# Patient Record
Sex: Female | Born: 1960 | Race: White | Hispanic: No | Smoking: Current every day smoker
Health system: Southern US, Community
[De-identification: ages and names within clinical notes are randomized; demographics above are authoritative.]

## PROBLEM LIST (undated history)

## (undated) DIAGNOSIS — E039 Hypothyroidism, unspecified: Secondary | ICD-10-CM

## (undated) DIAGNOSIS — I739 Peripheral vascular disease, unspecified: Secondary | ICD-10-CM

## (undated) DIAGNOSIS — R51 Headache: Secondary | ICD-10-CM

## (undated) DIAGNOSIS — G4486 Cervicogenic headache: Secondary | ICD-10-CM

## (undated) DIAGNOSIS — E785 Hyperlipidemia, unspecified: Secondary | ICD-10-CM

## (undated) DIAGNOSIS — J302 Other seasonal allergic rhinitis: Secondary | ICD-10-CM

## (undated) DIAGNOSIS — K589 Irritable bowel syndrome without diarrhea: Secondary | ICD-10-CM

## (undated) DIAGNOSIS — K219 Gastro-esophageal reflux disease without esophagitis: Secondary | ICD-10-CM

## (undated) DIAGNOSIS — A6000 Herpesviral infection of urogenital system, unspecified: Secondary | ICD-10-CM

## (undated) DIAGNOSIS — F419 Anxiety disorder, unspecified: Secondary | ICD-10-CM

## (undated) DIAGNOSIS — F32A Depression, unspecified: Secondary | ICD-10-CM

## (undated) DIAGNOSIS — E669 Obesity, unspecified: Secondary | ICD-10-CM

## (undated) DIAGNOSIS — I10 Essential (primary) hypertension: Secondary | ICD-10-CM

## (undated) DIAGNOSIS — I82409 Acute embolism and thrombosis of unspecified deep veins of unspecified lower extremity: Secondary | ICD-10-CM

## (undated) DIAGNOSIS — IMO0002 Reserved for concepts with insufficient information to code with codable children: Secondary | ICD-10-CM

## (undated) DIAGNOSIS — R42 Dizziness and giddiness: Secondary | ICD-10-CM

## (undated) DIAGNOSIS — H8390 Unspecified disease of inner ear, unspecified ear: Secondary | ICD-10-CM

## (undated) DIAGNOSIS — S139XXA Sprain of joints and ligaments of unspecified parts of neck, initial encounter: Secondary | ICD-10-CM

## (undated) DIAGNOSIS — F329 Major depressive disorder, single episode, unspecified: Secondary | ICD-10-CM

## (undated) DIAGNOSIS — K449 Diaphragmatic hernia without obstruction or gangrene: Secondary | ICD-10-CM

## (undated) DIAGNOSIS — L409 Psoriasis, unspecified: Secondary | ICD-10-CM

## (undated) DIAGNOSIS — G43909 Migraine, unspecified, not intractable, without status migrainosus: Secondary | ICD-10-CM

## (undated) HISTORY — DX: Dizziness and giddiness: R42

## (undated) HISTORY — DX: Obesity, unspecified: E66.9

## (undated) HISTORY — DX: Irritable bowel syndrome without diarrhea: K58.9

## (undated) HISTORY — DX: Cervicogenic headache: G44.86

## (undated) HISTORY — PX: CHOLECYSTECTOMY: SHX55

## (undated) HISTORY — DX: Migraine, unspecified, not intractable, without status migrainosus: G43.909

## (undated) HISTORY — DX: Headache: R51

## (undated) HISTORY — DX: Reserved for concepts with insufficient information to code with codable children: IMO0002

## (undated) HISTORY — PX: ABDOMINAL HYSTERECTOMY: SHX81

## (undated) HISTORY — DX: Herpesviral infection of urogenital system, unspecified: A60.00

## (undated) HISTORY — DX: Sprain of joints and ligaments of unspecified parts of neck, initial encounter: S13.9XXA

## (undated) HISTORY — DX: Psoriasis, unspecified: L40.9

## (undated) HISTORY — DX: Hyperlipidemia, unspecified: E78.5

---

## 2000-08-06 ENCOUNTER — Ambulatory Visit (HOSPITAL_BASED_OUTPATIENT_CLINIC_OR_DEPARTMENT_OTHER): Admission: RE | Admit: 2000-08-06 | Discharge: 2000-08-06 | Payer: Self-pay | Admitting: Plastic Surgery

## 2000-08-06 ENCOUNTER — Encounter (INDEPENDENT_AMBULATORY_CARE_PROVIDER_SITE_OTHER): Payer: Self-pay | Admitting: Specialist

## 2001-01-20 ENCOUNTER — Ambulatory Visit (HOSPITAL_COMMUNITY): Admission: RE | Admit: 2001-01-20 | Discharge: 2001-01-20 | Payer: Self-pay | Admitting: Gastroenterology

## 2001-06-15 ENCOUNTER — Other Ambulatory Visit: Admission: RE | Admit: 2001-06-15 | Discharge: 2001-06-15 | Payer: Self-pay | Admitting: Family Medicine

## 2002-01-28 ENCOUNTER — Other Ambulatory Visit: Admission: RE | Admit: 2002-01-28 | Discharge: 2002-01-28 | Payer: Self-pay

## 2002-09-01 ENCOUNTER — Other Ambulatory Visit: Admission: RE | Admit: 2002-09-01 | Discharge: 2002-09-01 | Payer: Self-pay

## 2003-03-13 ENCOUNTER — Other Ambulatory Visit: Admission: RE | Admit: 2003-03-13 | Discharge: 2003-03-13 | Payer: Self-pay

## 2003-07-28 ENCOUNTER — Other Ambulatory Visit: Admission: RE | Admit: 2003-07-28 | Discharge: 2003-07-28 | Payer: Self-pay | Admitting: Obstetrics and Gynecology

## 2003-12-01 ENCOUNTER — Other Ambulatory Visit: Admission: RE | Admit: 2003-12-01 | Discharge: 2003-12-01 | Payer: Self-pay | Admitting: Obstetrics and Gynecology

## 2004-05-31 ENCOUNTER — Other Ambulatory Visit: Admission: RE | Admit: 2004-05-31 | Discharge: 2004-05-31 | Payer: Self-pay | Admitting: Obstetrics and Gynecology

## 2004-06-21 ENCOUNTER — Encounter: Admission: RE | Admit: 2004-06-21 | Discharge: 2004-06-21 | Payer: Self-pay | Admitting: Family Medicine

## 2004-07-05 ENCOUNTER — Encounter: Admission: RE | Admit: 2004-07-05 | Discharge: 2004-07-05 | Payer: Self-pay | Admitting: Family Medicine

## 2005-01-10 ENCOUNTER — Encounter: Admission: RE | Admit: 2005-01-10 | Discharge: 2005-01-10 | Payer: Self-pay | Admitting: Family Medicine

## 2005-02-07 ENCOUNTER — Other Ambulatory Visit: Admission: RE | Admit: 2005-02-07 | Discharge: 2005-02-07 | Payer: Self-pay | Admitting: Obstetrics and Gynecology

## 2005-07-18 ENCOUNTER — Encounter: Admission: RE | Admit: 2005-07-18 | Discharge: 2005-07-18 | Payer: Self-pay | Admitting: Obstetrics and Gynecology

## 2005-08-18 DIAGNOSIS — I739 Peripheral vascular disease, unspecified: Secondary | ICD-10-CM

## 2005-08-18 HISTORY — DX: Peripheral vascular disease, unspecified: I73.9

## 2005-09-04 ENCOUNTER — Other Ambulatory Visit: Admission: RE | Admit: 2005-09-04 | Discharge: 2005-09-04 | Payer: Self-pay | Admitting: Obstetrics and Gynecology

## 2006-06-26 ENCOUNTER — Ambulatory Visit (HOSPITAL_COMMUNITY): Admission: RE | Admit: 2006-06-26 | Discharge: 2006-06-26 | Payer: Self-pay | Admitting: Family Medicine

## 2006-06-26 ENCOUNTER — Encounter: Payer: Self-pay | Admitting: Vascular Surgery

## 2006-07-31 ENCOUNTER — Encounter: Admission: RE | Admit: 2006-07-31 | Discharge: 2006-07-31 | Payer: Self-pay | Admitting: Obstetrics and Gynecology

## 2007-03-18 ENCOUNTER — Ambulatory Visit: Payer: Self-pay | Admitting: Hematology & Oncology

## 2007-04-14 LAB — CBC & DIFF AND RETIC
BASO%: 1.3 % (ref 0.0–2.0)
Basophils Absolute: 0.1 10*3/uL (ref 0.0–0.1)
EOS%: 3.1 % (ref 0.0–7.0)
Eosinophils Absolute: 0.2 10*3/uL (ref 0.0–0.5)
HCT: 24.6 % — ABNORMAL LOW (ref 34.8–46.6)
HGB: 7.5 g/dL — ABNORMAL LOW (ref 11.6–15.9)
IRF: 0.43 — ABNORMAL HIGH (ref 0.130–0.330)
LYMPH%: 22.5 % (ref 14.0–48.0)
MCH: 17.8 pg — ABNORMAL LOW (ref 26.0–34.0)
MCHC: 30.5 g/dL — ABNORMAL LOW (ref 32.0–36.0)
MCV: 58.2 fL — ABNORMAL LOW (ref 81.0–101.0)
MONO#: 0.4 10*3/uL (ref 0.1–0.9)
MONO%: 7.4 % (ref 0.0–13.0)
NEUT#: 3.6 10*3/uL (ref 1.5–6.5)
NEUT%: 65.7 % (ref 39.6–76.8)
Platelets: 209 10*3/uL (ref 145–400)
RBC: 4.23 10*6/uL (ref 3.70–5.32)
RDW: 20.9 % — ABNORMAL HIGH (ref 11.3–14.5)
RETIC #: 112.1 10*3/uL (ref 19.7–115.1)
Retic %: 2.7 % — ABNORMAL HIGH (ref 0.4–2.3)
WBC: 5.5 10*3/uL (ref 3.9–10.0)
lymph#: 1.2 10*3/uL (ref 0.9–3.3)

## 2007-04-16 LAB — TRANSFERRIN RECEPTOR, SOLUABLE: Transferrin Receptor, Soluble: 173.2 nmol/L

## 2007-05-26 ENCOUNTER — Ambulatory Visit: Payer: Self-pay | Admitting: Hematology & Oncology

## 2007-05-28 LAB — CBC & DIFF AND RETIC
BASO%: 0.5 % (ref 0.0–2.0)
EOS%: 4.4 % (ref 0.0–7.0)
HCT: 37.6 % (ref 34.8–46.6)
IRF: 0.34 — ABNORMAL HIGH (ref 0.130–0.330)
MCH: 27.6 pg (ref 26.0–34.0)
MCHC: 34.5 g/dL (ref 32.0–36.0)
MONO#: 0.3 10*3/uL (ref 0.1–0.9)
RBC: 4.7 10*6/uL (ref 3.70–5.32)
RDW: 32.4 % — ABNORMAL HIGH (ref 11.3–14.5)
RETIC #: 110.9 10*3/uL (ref 19.7–115.1)
Retic %: 2.4 % — ABNORMAL HIGH (ref 0.4–2.3)
WBC: 5.7 10*3/uL (ref 3.9–10.0)
lymph#: 1.3 10*3/uL (ref 0.9–3.3)

## 2007-05-28 LAB — CHCC SMEAR

## 2007-05-31 LAB — FERRITIN: Ferritin: 177 ng/mL (ref 10–291)

## 2007-08-25 ENCOUNTER — Ambulatory Visit: Payer: Self-pay | Admitting: Hematology & Oncology

## 2007-11-24 ENCOUNTER — Ambulatory Visit: Payer: Self-pay | Admitting: Hematology & Oncology

## 2008-06-16 ENCOUNTER — Encounter: Admission: RE | Admit: 2008-06-16 | Discharge: 2008-06-16 | Payer: Self-pay | Admitting: Obstetrics and Gynecology

## 2009-06-05 ENCOUNTER — Encounter: Admission: RE | Admit: 2009-06-05 | Discharge: 2009-06-05 | Payer: Self-pay | Admitting: Family Medicine

## 2009-06-09 ENCOUNTER — Encounter: Admission: RE | Admit: 2009-06-09 | Discharge: 2009-06-09 | Payer: Self-pay | Admitting: Family Medicine

## 2009-06-28 ENCOUNTER — Encounter: Admission: RE | Admit: 2009-06-28 | Discharge: 2009-06-28 | Payer: Self-pay | Admitting: Family Medicine

## 2009-10-04 ENCOUNTER — Emergency Department (HOSPITAL_COMMUNITY): Admission: EM | Admit: 2009-10-04 | Discharge: 2009-10-04 | Payer: Self-pay | Admitting: Emergency Medicine

## 2009-10-05 ENCOUNTER — Observation Stay (HOSPITAL_COMMUNITY): Admission: AD | Admit: 2009-10-05 | Discharge: 2009-10-06 | Payer: Self-pay | Admitting: Surgery

## 2009-10-05 ENCOUNTER — Encounter (INDEPENDENT_AMBULATORY_CARE_PROVIDER_SITE_OTHER): Payer: Self-pay | Admitting: Surgery

## 2010-02-13 ENCOUNTER — Encounter (INDEPENDENT_AMBULATORY_CARE_PROVIDER_SITE_OTHER): Payer: Self-pay | Admitting: Obstetrics and Gynecology

## 2010-02-13 ENCOUNTER — Ambulatory Visit (HOSPITAL_COMMUNITY): Admission: RE | Admit: 2010-02-13 | Discharge: 2010-02-14 | Payer: Self-pay | Admitting: Obstetrics and Gynecology

## 2010-08-23 ENCOUNTER — Encounter
Admission: RE | Admit: 2010-08-23 | Discharge: 2010-08-23 | Payer: Self-pay | Source: Home / Self Care | Attending: Obstetrics and Gynecology | Admitting: Obstetrics and Gynecology

## 2010-11-03 LAB — CBC
HCT: 25.5 % — ABNORMAL LOW (ref 36.0–46.0)
HCT: 32.4 % — ABNORMAL LOW (ref 36.0–46.0)
Hemoglobin: 10.3 g/dL — ABNORMAL LOW (ref 12.0–15.0)
Hemoglobin: 8.2 g/dL — ABNORMAL LOW (ref 12.0–15.0)
MCH: 21.8 pg — ABNORMAL LOW (ref 26.0–34.0)
MCH: 21.9 pg — ABNORMAL LOW (ref 26.0–34.0)
MCHC: 31.9 g/dL (ref 30.0–36.0)
MCV: 68.3 fL — ABNORMAL LOW (ref 78.0–100.0)
MCV: 68.4 fL — ABNORMAL LOW (ref 78.0–100.0)
Platelets: 221 10*3/uL (ref 150–400)
Platelets: 247 10*3/uL (ref 150–400)
RBC: 3.73 MIL/uL — ABNORMAL LOW (ref 3.87–5.11)
RBC: 4.75 MIL/uL (ref 3.87–5.11)
RDW: 18.3 % — ABNORMAL HIGH (ref 11.5–15.5)
WBC: 7.2 10*3/uL (ref 4.0–10.5)

## 2010-11-03 LAB — SURGICAL PCR SCREEN
MRSA, PCR: NEGATIVE
Staphylococcus aureus: POSITIVE — AB

## 2010-11-03 LAB — GLUCOSE, CAPILLARY
Glucose-Capillary: 148 mg/dL — ABNORMAL HIGH (ref 70–99)
Glucose-Capillary: 156 mg/dL — ABNORMAL HIGH (ref 70–99)
Glucose-Capillary: 183 mg/dL — ABNORMAL HIGH (ref 70–99)
Glucose-Capillary: 187 mg/dL — ABNORMAL HIGH (ref 70–99)
Glucose-Capillary: 233 mg/dL — ABNORMAL HIGH (ref 70–99)

## 2010-11-03 LAB — PREGNANCY, URINE: Preg Test, Ur: NEGATIVE

## 2010-11-03 LAB — HEMOGLOBIN A1C
Hgb A1c MFr Bld: 6.9 % — ABNORMAL HIGH (ref <5.7)
Mean Plasma Glucose: 151 mg/dL — ABNORMAL HIGH (ref <117)

## 2010-11-03 LAB — COMPREHENSIVE METABOLIC PANEL
Alkaline Phosphatase: 57 U/L (ref 39–117)
BUN: 4 mg/dL — ABNORMAL LOW (ref 6–23)
CO2: 28 mEq/L (ref 19–32)
Chloride: 101 mEq/L (ref 96–112)
Creatinine, Ser: 0.57 mg/dL (ref 0.4–1.2)
GFR calc non Af Amer: >60 mL/min (ref >60)
Glucose, Bld: 147 mg/dL — ABNORMAL HIGH (ref 70–99)
Total Bilirubin: 0.6 mg/dL (ref 0.3–1.2)

## 2010-11-03 LAB — BASIC METABOLIC PANEL
BUN: 8 mg/dL (ref 6–23)
CO2: 24 mEq/L (ref 19–32)
Calcium: 9.5 mg/dL (ref 8.4–10.5)
Chloride: 104 mEq/L (ref 96–112)
Creatinine, Ser: 0.79 mg/dL (ref 0.4–1.2)
GFR calc Af Amer: >60 mL/min (ref >60)
GFR calc non Af Amer: >60 mL/min (ref >60)
Glucose, Bld: 210 mg/dL — ABNORMAL HIGH (ref 70–99)
Potassium: 3.8 mEq/L (ref 3.5–5.1)
Sodium: 135 mEq/L (ref 135–145)

## 2010-11-03 LAB — ABO/RH: ABO/RH(D): AB POS

## 2010-11-03 LAB — TYPE AND SCREEN
ABO/RH(D): AB POS
Antibody Screen: NEGATIVE

## 2010-11-07 LAB — COMPREHENSIVE METABOLIC PANEL
ALT: 13 U/L (ref 0–35)
AST: 26 U/L (ref 0–37)
Albumin: 3.8 g/dL (ref 3.5–5.2)
Alkaline Phosphatase: 78 U/L (ref 39–117)
BUN: 12 mg/dL (ref 6–23)
Calcium: 8.7 mg/dL (ref 8.4–10.5)
Chloride: 107 mEq/L (ref 96–112)
Creatinine, Ser: 0.62 mg/dL (ref 0.4–1.2)
GFR calc non Af Amer: >60 mL/min (ref >60)
GFR calc non Af Amer: >60 mL/min (ref >60)
Glucose, Bld: 142 mg/dL — ABNORMAL HIGH (ref 70–99)
Glucose, Bld: 158 mg/dL — ABNORMAL HIGH (ref 70–99)
Potassium: 3.5 mEq/L (ref 3.5–5.1)
Potassium: 4 mEq/L (ref 3.5–5.1)
Total Bilirubin: 0.3 mg/dL (ref 0.3–1.2)
Total Bilirubin: 0.8 mg/dL (ref 0.3–1.2)

## 2010-11-07 LAB — URINALYSIS, ROUTINE W REFLEX MICROSCOPIC
Glucose, UA: NEGATIVE mg/dL
Leukocytes, UA: NEGATIVE
Specific Gravity, Urine: 1.028 (ref 1.005–1.030)

## 2010-11-07 LAB — URINE CULTURE: Colony Count: NO GROWTH

## 2010-11-07 LAB — DIFFERENTIAL
Basophils Absolute: 0.1 10*3/uL (ref 0.0–0.1)
Eosinophils Absolute: 0.2 10*3/uL (ref 0.0–0.7)
Lymphocytes Relative: 13 % (ref 12–46)
Monocytes Relative: 5 % (ref 3–12)
Neutro Abs: 7.6 10*3/uL (ref 1.7–7.7)
Neutrophils Relative %: 79 % — ABNORMAL HIGH (ref 43–77)

## 2010-11-07 LAB — URINE MICROSCOPIC-ADD ON

## 2010-11-07 LAB — CBC
HCT: 28.6 % — ABNORMAL LOW (ref 36.0–46.0)
HCT: 33.4 % — ABNORMAL LOW (ref 36.0–46.0)
Hemoglobin: 10.6 g/dL — ABNORMAL LOW (ref 12.0–15.0)
Hemoglobin: 9.4 g/dL — ABNORMAL LOW (ref 12.0–15.0)
MCHC: 32.8 g/dL (ref 30.0–36.0)
MCV: 71.9 fL — ABNORMAL LOW (ref 78.0–100.0)
MCV: 72.4 fL — ABNORMAL LOW (ref 78.0–100.0)
Platelets: 269 10*3/uL (ref 150–400)
Platelets: 318 10*3/uL (ref 150–400)
RDW: 28.2 % — ABNORMAL HIGH (ref 11.5–15.5)
WBC: 9.6 10*3/uL (ref 4.0–10.5)

## 2010-11-07 LAB — LIPASE, BLOOD: Lipase: 32 U/L (ref 11–59)

## 2010-11-07 LAB — TROPONIN I: Troponin I: 0.01 ng/mL (ref 0.00–0.06)

## 2010-11-07 LAB — CK TOTAL AND CKMB (NOT AT ARMC): Relative Index: INVALID (ref 0.0–2.5)

## 2011-01-03 NOTE — Procedures (Signed)
[facility]  Patient:    Erin Chandler, Erin Chandler                         MRN: [id number] Proc. Date: 01/20/01 Adm. Date:  [id number] Attending:  Louie Bun CC:         Stacie Acres. Cliffton Asters, M.D.   Procedure Report  PROCEDURE:  Esophagogastroduodenoscopy.  ENDOSCOPIST:  Everardo All. Madilyn Fireman, M.D.  INDICATIONS FOR PROCEDURE:  Severe reflux symptoms with hoarseness not adequately controlled by proton pump inhibitor.  DESCRIPTION OF PROCEDURE:   The patient was placed in the left lateral decubitus position and placed on the pulse monitor with continuous low-flow oxygen delivered by nasal cannula.  She was sedated with 50 mg IV Demerol and 6 mg IV Versed.  The Olympus video endoscope was advanced under direct vision into the oropharynx and esophagus.  The esophagus was straight and of normal caliber with the squamocolumnar line at 36 cm above a 3 cm hiatal hernia.  The GE junction appeared very patulous and was reflux was noted during the procedure.  There were no visible erosions and no ring or stricture appreciated.  The stomach was entered, and a small amount of liquid secretions were suctioned from the fundus.  Retroflexed view of the cardia was somewhat difficult due to inability to hold air, but was otherwise unremarkable.  The body and antrum appeared normal.  The duodenum was entered and both bulb and second portion were well inspected and appeared to be within normal limits. The endoscope was then withdrawn back into the stomach and CLOtest obtained. The scope was then withdrawn, and the patient returned to the recovery room in stable condition.  She tolerated the procedure well, and there were no immediate complications.  IMPRESSION:  A 3 cm hiatal hernia with patulous gastroesophageal junction.  PLAN:  Will continue proton pump inhibitor.  Consider adding a nighttime dose of Reglan, and will evaluate for possibility of antireflux surgery if  medical therapy is not satisfactory. DD:  01/20/01 TD:  01/20/01 Job: [id number]

## 2011-05-07 ENCOUNTER — Other Ambulatory Visit: Payer: Self-pay | Admitting: Dermatology

## 2011-07-04 ENCOUNTER — Other Ambulatory Visit: Payer: Self-pay | Admitting: Orthopedic Surgery

## 2011-07-08 ENCOUNTER — Encounter (HOSPITAL_BASED_OUTPATIENT_CLINIC_OR_DEPARTMENT_OTHER): Payer: Self-pay | Admitting: *Deleted

## 2011-07-14 ENCOUNTER — Encounter (HOSPITAL_BASED_OUTPATIENT_CLINIC_OR_DEPARTMENT_OTHER)
Admission: RE | Admit: 2011-07-14 | Discharge: 2011-07-14 | Disposition: A | Discharge status: Home / Self Care | Payer: Managed Care, Other (non HMO) | Source: Ambulatory Visit | Attending: Orthopedic Surgery | Admitting: Orthopedic Surgery

## 2011-07-14 ENCOUNTER — Other Ambulatory Visit: Payer: Self-pay

## 2011-07-14 LAB — BASIC METABOLIC PANEL
BUN: 13 mg/dL (ref 6–23)
CO2: 24 mEq/L (ref 19–32)
Glucose, Bld: 138 mg/dL — ABNORMAL HIGH (ref 70–99)
Potassium: 3.9 mEq/L (ref 3.5–5.1)
Sodium: 138 mEq/L (ref 135–145)

## 2011-07-16 ENCOUNTER — Encounter (HOSPITAL_BASED_OUTPATIENT_CLINIC_OR_DEPARTMENT_OTHER): Payer: Self-pay | Admitting: Anesthesiology

## 2011-07-16 ENCOUNTER — Encounter (HOSPITAL_BASED_OUTPATIENT_CLINIC_OR_DEPARTMENT_OTHER): Payer: Self-pay | Admitting: Orthopedic Surgery

## 2011-07-16 ENCOUNTER — Encounter (HOSPITAL_BASED_OUTPATIENT_CLINIC_OR_DEPARTMENT_OTHER)
Admission: RE | Disposition: A | Discharge status: Home / Self Care | Payer: Self-pay | Source: Ambulatory Visit | Attending: Orthopedic Surgery

## 2011-07-16 ENCOUNTER — Ambulatory Visit (HOSPITAL_BASED_OUTPATIENT_CLINIC_OR_DEPARTMENT_OTHER): Payer: Managed Care, Other (non HMO) | Admitting: Anesthesiology

## 2011-07-16 ENCOUNTER — Ambulatory Visit (HOSPITAL_BASED_OUTPATIENT_CLINIC_OR_DEPARTMENT_OTHER)
Admission: RE | Admit: 2011-07-16 | Discharge: 2011-07-16 | Disposition: A | Discharge status: Home / Self Care | Payer: Managed Care, Other (non HMO) | Source: Ambulatory Visit | Attending: Orthopedic Surgery | Admitting: Orthopedic Surgery

## 2011-07-16 DIAGNOSIS — E119 Type 2 diabetes mellitus without complications: Secondary | ICD-10-CM | POA: Insufficient documentation

## 2011-07-16 DIAGNOSIS — I10 Essential (primary) hypertension: Secondary | ICD-10-CM | POA: Insufficient documentation

## 2011-07-16 DIAGNOSIS — Z01810 Encounter for preprocedural cardiovascular examination: Secondary | ICD-10-CM | POA: Insufficient documentation

## 2011-07-16 DIAGNOSIS — Z01812 Encounter for preprocedural laboratory examination: Secondary | ICD-10-CM | POA: Insufficient documentation

## 2011-07-16 DIAGNOSIS — G5600 Carpal tunnel syndrome, unspecified upper limb: Secondary | ICD-10-CM | POA: Insufficient documentation

## 2011-07-16 HISTORY — DX: Headache: R51

## 2011-07-16 HISTORY — PX: CARPAL TUNNEL RELEASE: SHX101

## 2011-07-16 HISTORY — DX: Hypothyroidism, unspecified: E03.9

## 2011-07-16 HISTORY — DX: Major depressive disorder, single episode, unspecified: F32.9

## 2011-07-16 HISTORY — DX: Depression, unspecified: F32.A

## 2011-07-16 HISTORY — DX: Gastro-esophageal reflux disease without esophagitis: K21.9

## 2011-07-16 HISTORY — DX: Unspecified disease of inner ear, unspecified ear: H83.90

## 2011-07-16 HISTORY — DX: Other seasonal allergic rhinitis: J30.2

## 2011-07-16 HISTORY — DX: Peripheral vascular disease, unspecified: I73.9

## 2011-07-16 HISTORY — DX: Anxiety disorder, unspecified: F41.9

## 2011-07-16 HISTORY — DX: Essential (primary) hypertension: I10

## 2011-07-16 HISTORY — DX: Diaphragmatic hernia without obstruction or gangrene: K44.9

## 2011-07-16 HISTORY — DX: Acute embolism and thrombosis of unspecified deep veins of unspecified lower extremity: I82.409

## 2011-07-16 LAB — POCT HEMOGLOBIN-HEMACUE: Hemoglobin: 11.9 g/dL — ABNORMAL LOW (ref 12.0–15.0)

## 2011-07-16 SURGERY — CARPAL TUNNEL RELEASE
Anesthesia: Monitor Anesthesia Care | Site: Wrist | Laterality: Right | Wound class: Clean

## 2011-07-16 MED ORDER — MEPERIDINE HCL 25 MG/ML IJ SOLN: 6.2500 mg | INTRAMUSCULAR | Status: DC | PRN

## 2011-07-16 MED ORDER — CEFAZOLIN SODIUM 1-5 GM-% IV SOLN
1.0000 g | INTRAVENOUS | Status: AC
  Administered 2011-07-16: 1 g via INTRAVENOUS

## 2011-07-16 MED ORDER — FENTANYL CITRATE 0.05 MG/ML IJ SOLN: 25.0000 ug | INTRAMUSCULAR | Status: DC | PRN

## 2011-07-16 MED ORDER — PENTAZOCINE-NALOXONE 50-0.5 MG PO TABS: 1.0000 | ORAL_TABLET | ORAL | Status: AC | PRN

## 2011-07-16 MED ORDER — FENTANYL CITRATE 0.05 MG/ML IJ SOLN
INTRAMUSCULAR | Status: DC | PRN
  Administered 2011-07-16: 50 ug via INTRAVENOUS

## 2011-07-16 MED ORDER — ONDANSETRON HCL 4 MG/2ML IJ SOLN
INTRAMUSCULAR | Status: DC | PRN
  Administered 2011-07-16: 4 mg via INTRAVENOUS

## 2011-07-16 MED ORDER — LACTATED RINGERS IV SOLN
INTRAVENOUS | Status: DC
  Administered 2011-07-16 (×2): via INTRAVENOUS

## 2011-07-16 MED ORDER — PROMETHAZINE HCL 25 MG/ML IJ SOLN: 6.2500 mg | INTRAMUSCULAR | Status: DC | PRN

## 2011-07-16 MED ORDER — CHLORHEXIDINE GLUCONATE 4 % EX LIQD: 60.0000 mL | Freq: Once | CUTANEOUS | Status: DC

## 2011-07-16 MED ORDER — DEXAMETHASONE SODIUM PHOSPHATE 4 MG/ML IJ SOLN
INTRAMUSCULAR | Status: DC | PRN
  Administered 2011-07-16: 10 mg via INTRAVENOUS

## 2011-07-16 MED ORDER — BUPIVACAINE HCL (PF) 0.25 % IJ SOLN
INTRAMUSCULAR | Status: DC | PRN
  Administered 2011-07-16: 5 mL

## 2011-07-16 SURGICAL SUPPLY — 38 items
BANDAGE COBAN STERILE 3 (GAUZE/BANDAGES/DRESSINGS) ×1 IMPLANT
BANDAGE GAUZE ELAST BULKY 4 IN (GAUZE/BANDAGES/DRESSINGS) ×2 IMPLANT
BLADE SURG 15 STRL LF DISP TIS (BLADE) ×1 IMPLANT
BLADE SURG 15 STRL SS (BLADE) ×2
BNDG CMPR 9X4 STRL LF SNTH (GAUZE/BANDAGES/DRESSINGS)
BNDG COHESIVE 3X5 TAN STRL LF (GAUZE/BANDAGES/DRESSINGS) ×2 IMPLANT
BNDG ESMARK 4X9 LF (GAUZE/BANDAGES/DRESSINGS) IMPLANT
CHLORAPREP W/TINT 26ML (MISCELLANEOUS) ×2 IMPLANT
CLOTH BEACON ORANGE TIMEOUT ST (SAFETY) ×2 IMPLANT
CORDS BIPOLAR (ELECTRODE) ×2 IMPLANT
COVER MAYO STAND STRL (DRAPES) ×2 IMPLANT
COVER TABLE BACK 60X90 (DRAPES) ×2 IMPLANT
CUFF TOURNIQUET SINGLE 18IN (TOURNIQUET CUFF) ×1 IMPLANT
DRAPE EXTREMITY T 121X128X90 (DRAPE) ×2 IMPLANT
DRAPE SURG 17X23 STRL (DRAPES) ×2 IMPLANT
DRSG KUZMA FLUFF (GAUZE/BANDAGES/DRESSINGS) ×2 IMPLANT
GAUZE KERLIX 2  STERILE LF (GAUZE/BANDAGES/DRESSINGS) ×1 IMPLANT
GAUZE XEROFORM 1X8 LF (GAUZE/BANDAGES/DRESSINGS) ×2 IMPLANT
GLOVE BIO SURGEON STRL SZ 6.5 (GLOVE) ×2 IMPLANT
GLOVE SURG ORTHO 8.0 STRL STRW (GLOVE) ×2 IMPLANT
GOWN BRE IMP PREV XXLGXLNG (GOWN DISPOSABLE) ×2 IMPLANT
GOWN PREVENTION PLUS XLARGE (GOWN DISPOSABLE) ×2 IMPLANT
NEEDLE 27GAX1X1/2 (NEEDLE) IMPLANT
PACK BASIN DAY SURGERY FS (CUSTOM PROCEDURE TRAY) ×2 IMPLANT
PAD CAST 3X4 CTTN HI CHSV (CAST SUPPLIES) ×1 IMPLANT
PADDING CAST ABS 4INX4YD NS (CAST SUPPLIES) ×1
PADDING CAST ABS COTTON 4X4 ST (CAST SUPPLIES) ×1 IMPLANT
PADDING CAST COTTON 3X4 STRL (CAST SUPPLIES) ×2
PADDING WEBRIL 3 STERILE (GAUZE/BANDAGES/DRESSINGS) ×1 IMPLANT
SPONGE GAUZE 4X4 12PLY (GAUZE/BANDAGES/DRESSINGS) ×2 IMPLANT
STOCKINETTE 4X48 STRL (DRAPES) ×2 IMPLANT
SUT VICRYL 4-0 PS2 18IN ABS (SUTURE) IMPLANT
SUT VICRYL RAPIDE 4/0 PS 2 (SUTURE) ×2 IMPLANT
SYR BULB 3OZ (MISCELLANEOUS) ×2 IMPLANT
SYR CONTROL 10ML LL (SYRINGE) IMPLANT
TOWEL OR 17X24 6PK STRL BLUE (TOWEL DISPOSABLE) ×2 IMPLANT
UNDERPAD 30X30 INCONTINENT (UNDERPADS AND DIAPERS) ×2 IMPLANT
WATER STERILE IRR 1000ML POUR (IV SOLUTION) ×2 IMPLANT

## 2011-07-16 NOTE — Brief Op Note (Signed)
07/16/2011  10:04 AM  PATIENT:  Erin Chandler  50 y.o. female  PRE-OPERATIVE DIAGNOSIS:  carpal tunnel syndrome right  POST-OPERATIVE DIAGNOSIS:  same as preop  PROCEDURE:  Procedure(s): CARPAL TUNNEL RELEASE  SURGEON:  Surgeon(s): Nicki Reaper, MD  PHYSICIAN ASSISTANT:   ASSISTANTS: none   ANESTHESIA:   local and regional  EBL:     BLOOD ADMINISTERED:none  DRAINS: none   LOCAL MEDICATIONS USED:  MARCAINE 5CC  SPECIMEN:  No Specimen  DISPOSITION OF SPECIMEN:  N/A  COUNTS:  YES  TOURNIQUET:   Total Tourniquet Time Documented: Forearm (Right) - 17 minutes  DICTATION: .Note written in EPIC  PLAN OF CARE: Discharge to home after PACU  PATIENT DISPOSITION:  PACU - hemodynamically stable.

## 2011-07-16 NOTE — Anesthesia Postprocedure Evaluation (Signed)
  Anesthesia Post-op Note  Patient: Erin Chandler  Procedure(s) Performed:  CARPAL TUNNEL RELEASE  Patient Location: PACU  Anesthesia Type: MAC and Bier block  Level of Consciousness: awake  Airway and Oxygen Therapy: Patient Spontanous Breathing and Patient connected to face mask oxygen  Post-op Pain: none  Post-op Assessment: Post-op Vital signs reviewed, Patient's Cardiovascular Status Stable, Respiratory Function Stable and Patent Airway  Post-op Vital Signs: stable  Complications: No apparent anesthesia complications

## 2011-07-16 NOTE — Transfer of Care (Signed)
Immediate Anesthesia Transfer of Care Note  Patient: Erin Chandler  Procedure(s) Performed:  CARPAL TUNNEL RELEASE  Patient Location: PACU  Anesthesia Type: MAC and Bier block  Level of Consciousness: awake and oriented  Airway & Oxygen Therapy: Patient Spontanous Breathing and Patient connected to face mask oxygen  Post-op Assessment: Report given to PACU RN and Post -op Vital signs reviewed and stable  Post vital signs: Reviewed and stable  Complications: No apparent anesthesia complications

## 2011-07-16 NOTE — Op Note (Signed)
Pre op: CTS Rt Post op: same  Operation: CTR Rt  Surgeon: Daryll Brod PROCEDURE:  The patient was brought to the operating room where a forearm based IV regional anesthetic was carried out without difficulty. The patient was prepped using ChloraPrep, supine position, right arm free.  A 3- minute dry time was allowed.  Time-out taken confirming the patient and procedure.  A longitudinal incision was made in the palm, carried down through subcutaneous tissue.  Bleeders were electrocauterized.  Palmar fascia was split.  Superficial palmar arch identified.  The flexor tendon to the ring and little finger identified.  To the ulnar side of the median nerve, the carpal retinaculum was incised with sharp dissection.  Right angle and Sewell retractor were placed between skin and forearm fascia.  The fascia was released for approximately a cm and half proximal to the wrist crease under direct vision.  The canal was explored.  Area of compression to the nerve was apparent.  No further lesions were identified.  The wound was irrigated.  The skin was then closed with interrupted 5-0 Vicryl Rapide sutures.  A local infiltration with 0.25% Marcaine without epinephrine was then injected, approximately 5 mL was used.  A sterile compressive dressing was applied with the fingers free.  On deflation of the tourniquet, all fingers immediately pinked.  She was taken to the recovery room for observation in satisfactory condition.

## 2011-07-16 NOTE — Anesthesia Preprocedure Evaluation (Signed)
Anesthesia Evaluation  Patient identified by MRN, date of birth, ID band Patient awake    Reviewed: Allergy & Precautions, H&P , NPO status , Patient's Chart, lab work & pertinent test results  Airway Mallampati: III TM Distance: >3 FB Neck ROM: full    Dental No notable dental hx. (+) Teeth Intact   Pulmonary  clear to auscultation  Pulmonary exam normal       Cardiovascular hypertension, On Medications regular Normal    Neuro/Psych Negative Psych ROS   GI/Hepatic Neg liver ROS, Controlled and Medicated,  Endo/Other  Well Controlled, Type 2  Renal/GU negative Renal ROS  Genitourinary negative   Musculoskeletal   Abdominal   Peds  Hematology negative hematology ROS (+)   Anesthesia Other Findings   Reproductive/Obstetrics negative OB ROS                           Anesthesia Physical Anesthesia Plan  ASA: III  Anesthesia Plan: MAC and Bier Block   Post-op Pain Management:    Induction: Intravenous  Airway Management Planned: Mask  Additional Equipment:   Intra-op Plan:   Post-operative Plan:   Informed Consent: I have reviewed the patients History and Physical, chart, labs and discussed the procedure including the risks, benefits and alternatives for the proposed anesthesia with the patient or authorized representative who has indicated his/her understanding and acceptance.     Plan Discussed with: CRNA  Anesthesia Plan Comments:         Anesthesia Quick Evaluation
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
## 2011-07-18 ENCOUNTER — Encounter (HOSPITAL_BASED_OUTPATIENT_CLINIC_OR_DEPARTMENT_OTHER): Payer: Self-pay | Admitting: Orthopedic Surgery

## 2011-08-05 ENCOUNTER — Other Ambulatory Visit: Payer: Self-pay | Admitting: Orthopedic Surgery

## 2011-08-07 ENCOUNTER — Encounter (HOSPITAL_BASED_OUTPATIENT_CLINIC_OR_DEPARTMENT_OTHER): Payer: Self-pay | Admitting: *Deleted

## 2011-08-08 ENCOUNTER — Encounter (HOSPITAL_BASED_OUTPATIENT_CLINIC_OR_DEPARTMENT_OTHER)
Admission: RE | Admit: 2011-08-08 | Discharge: 2011-08-08 | Disposition: A | Discharge status: Home / Self Care | Payer: Managed Care, Other (non HMO) | Source: Ambulatory Visit | Attending: Orthopedic Surgery | Admitting: Orthopedic Surgery

## 2011-08-08 LAB — BASIC METABOLIC PANEL
GFR calc non Af Amer: >90 mL/min (ref >90)
Glucose, Bld: 134 mg/dL — ABNORMAL HIGH (ref 70–99)
Potassium: 4 mEq/L (ref 3.5–5.1)
Sodium: 136 mEq/L (ref 135–145)

## 2011-08-13 ENCOUNTER — Ambulatory Visit (HOSPITAL_BASED_OUTPATIENT_CLINIC_OR_DEPARTMENT_OTHER): Payer: Managed Care, Other (non HMO) | Admitting: Certified Registered Nurse Anesthetist

## 2011-08-13 ENCOUNTER — Encounter (HOSPITAL_BASED_OUTPATIENT_CLINIC_OR_DEPARTMENT_OTHER): Payer: Self-pay | Admitting: Certified Registered Nurse Anesthetist

## 2011-08-13 ENCOUNTER — Encounter (HOSPITAL_BASED_OUTPATIENT_CLINIC_OR_DEPARTMENT_OTHER): Payer: Self-pay

## 2011-08-13 ENCOUNTER — Encounter (HOSPITAL_BASED_OUTPATIENT_CLINIC_OR_DEPARTMENT_OTHER)
Admission: RE | Disposition: A | Discharge status: Home / Self Care | Payer: Self-pay | Source: Ambulatory Visit | Attending: Orthopedic Surgery

## 2011-08-13 ENCOUNTER — Ambulatory Visit (HOSPITAL_BASED_OUTPATIENT_CLINIC_OR_DEPARTMENT_OTHER)
Admission: RE | Admit: 2011-08-13 | Discharge: 2011-08-13 | Disposition: A | Discharge status: Home / Self Care | Payer: Managed Care, Other (non HMO) | Source: Ambulatory Visit | Attending: Orthopedic Surgery | Admitting: Orthopedic Surgery

## 2011-08-13 DIAGNOSIS — K449 Diaphragmatic hernia without obstruction or gangrene: Secondary | ICD-10-CM | POA: Insufficient documentation

## 2011-08-13 DIAGNOSIS — J45909 Unspecified asthma, uncomplicated: Secondary | ICD-10-CM | POA: Insufficient documentation

## 2011-08-13 DIAGNOSIS — Z01812 Encounter for preprocedural laboratory examination: Secondary | ICD-10-CM | POA: Insufficient documentation

## 2011-08-13 DIAGNOSIS — K08409 Partial loss of teeth, unspecified cause, unspecified class: Secondary | ICD-10-CM | POA: Insufficient documentation

## 2011-08-13 DIAGNOSIS — R51 Headache: Secondary | ICD-10-CM | POA: Insufficient documentation

## 2011-08-13 DIAGNOSIS — I10 Essential (primary) hypertension: Secondary | ICD-10-CM | POA: Insufficient documentation

## 2011-08-13 DIAGNOSIS — G5600 Carpal tunnel syndrome, unspecified upper limb: Secondary | ICD-10-CM | POA: Insufficient documentation

## 2011-08-13 DIAGNOSIS — K219 Gastro-esophageal reflux disease without esophagitis: Secondary | ICD-10-CM | POA: Insufficient documentation

## 2011-08-13 HISTORY — PX: CARPAL TUNNEL RELEASE: SHX101

## 2011-08-13 LAB — GLUCOSE, CAPILLARY
Glucose-Capillary: 132 mg/dL — ABNORMAL HIGH (ref 70–99)
Glucose-Capillary: 136 mg/dL — ABNORMAL HIGH (ref 70–99)

## 2011-08-13 SURGERY — CARPAL TUNNEL RELEASE
Anesthesia: Monitor Anesthesia Care | Laterality: Left

## 2011-08-13 MED ORDER — PENTAZOCINE-NALOXONE 50-0.5 MG PO TABS: 1.0000 | ORAL_TABLET | ORAL | Status: AC | PRN

## 2011-08-13 MED ORDER — MEPERIDINE HCL 25 MG/ML IJ SOLN: 6.2500 mg | INTRAMUSCULAR | Status: DC | PRN

## 2011-08-13 MED ORDER — LIDOCAINE HCL (CARDIAC) 20 MG/ML IV SOLN
INTRAVENOUS | Status: DC | PRN
  Administered 2011-08-13: 6 mg via INTRAVENOUS

## 2011-08-13 MED ORDER — LACTATED RINGERS IV SOLN
INTRAVENOUS | Status: DC
  Administered 2011-08-13: 08:00:00 via INTRAVENOUS

## 2011-08-13 MED ORDER — BUPIVACAINE HCL (PF) 0.25 % IJ SOLN
INTRAMUSCULAR | Status: DC | PRN
  Administered 2011-08-13: 6 mL

## 2011-08-13 MED ORDER — PROMETHAZINE HCL 25 MG/ML IJ SOLN
6.2500 mg | INTRAMUSCULAR | Status: DC | PRN
  Administered 2011-08-13: 6.25 mg via INTRAVENOUS

## 2011-08-13 MED ORDER — FENTANYL CITRATE 0.05 MG/ML IJ SOLN
INTRAMUSCULAR | Status: DC | PRN
  Administered 2011-08-13: 50 ug via INTRAVENOUS

## 2011-08-13 MED ORDER — MIDAZOLAM HCL 5 MG/5ML IJ SOLN
INTRAMUSCULAR | Status: DC | PRN
  Administered 2011-08-13: 2 mg via INTRAVENOUS

## 2011-08-13 MED ORDER — FENTANYL CITRATE 0.05 MG/ML IJ SOLN: 25.0000 ug | INTRAMUSCULAR | Status: DC | PRN

## 2011-08-13 MED ORDER — CHLORHEXIDINE GLUCONATE 4 % EX LIQD: 60.0000 mL | Freq: Once | CUTANEOUS | Status: DC

## 2011-08-13 MED ORDER — PENTAZOCINE-NALOXONE HCL 50-0.5 MG PO TABS
1.0000 | ORAL_TABLET | ORAL | Status: DC | PRN
  Administered 2011-08-13: 1 via ORAL

## 2011-08-13 MED ORDER — LIDOCAINE HCL (PF) 0.5 % IJ SOLN
INTRAMUSCULAR | Status: DC | PRN
  Administered 2011-08-13: 30 mL

## 2011-08-13 MED ORDER — PROPOFOL 10 MG/ML IV EMUL
INTRAVENOUS | Status: DC | PRN
  Administered 2011-08-13: 75 ug/kg/min via INTRAVENOUS

## 2011-08-13 MED ORDER — ONDANSETRON HCL 4 MG/2ML IJ SOLN
INTRAMUSCULAR | Status: DC | PRN
  Administered 2011-08-13: 4 mg via INTRAVENOUS

## 2011-08-13 SURGICAL SUPPLY — 36 items
BANDAGE GAUZE ELAST BULKY 4 IN (GAUZE/BANDAGES/DRESSINGS) ×2 IMPLANT
BLADE SURG 15 STRL LF DISP TIS (BLADE) ×1 IMPLANT
BLADE SURG 15 STRL SS (BLADE) ×2
BNDG CMPR 9X4 STRL LF SNTH (GAUZE/BANDAGES/DRESSINGS)
BNDG COHESIVE 3X5 TAN STRL LF (GAUZE/BANDAGES/DRESSINGS) ×2 IMPLANT
BNDG ESMARK 4X9 LF (GAUZE/BANDAGES/DRESSINGS) IMPLANT
CHLORAPREP W/TINT 26ML (MISCELLANEOUS) ×2 IMPLANT
CLOTH BEACON ORANGE TIMEOUT ST (SAFETY) ×2 IMPLANT
CORDS BIPOLAR (ELECTRODE) ×2 IMPLANT
COVER MAYO STAND STRL (DRAPES) ×2 IMPLANT
COVER TABLE BACK 60X90 (DRAPES) ×2 IMPLANT
CUFF TOURNIQUET SINGLE 18IN (TOURNIQUET CUFF) IMPLANT
DRAPE EXTREMITY T 121X128X90 (DRAPE) ×2 IMPLANT
DRAPE SURG 17X23 STRL (DRAPES) ×2 IMPLANT
DRSG KUZMA FLUFF (GAUZE/BANDAGES/DRESSINGS) ×2 IMPLANT
GAUZE XEROFORM 1X8 LF (GAUZE/BANDAGES/DRESSINGS) ×2 IMPLANT
GLOVE BIO SURGEON STRL SZ 6.5 (GLOVE) ×1 IMPLANT
GLOVE SS N UNI LF 8.0 STRL (GLOVE) ×1 IMPLANT
GLOVE SURG ORTHO 8.0 STRL STRW (GLOVE) ×1 IMPLANT
GOWN BRE IMP PREV XXLGXLNG (GOWN DISPOSABLE) ×2 IMPLANT
GOWN PREVENTION PLUS XLARGE (GOWN DISPOSABLE) ×2 IMPLANT
NEEDLE 27GAX1X1/2 (NEEDLE) IMPLANT
PACK BASIN DAY SURGERY FS (CUSTOM PROCEDURE TRAY) ×2 IMPLANT
PAD CAST 3X4 CTTN HI CHSV (CAST SUPPLIES) ×1 IMPLANT
PADDING CAST ABS 4INX4YD NS (CAST SUPPLIES) ×1
PADDING CAST ABS COTTON 4X4 ST (CAST SUPPLIES) ×1 IMPLANT
PADDING CAST COTTON 3X4 STRL (CAST SUPPLIES) ×2
SPONGE GAUZE 4X4 12PLY (GAUZE/BANDAGES/DRESSINGS) ×2 IMPLANT
STOCKINETTE 4X48 STRL (DRAPES) ×2 IMPLANT
SUT VICRYL 4-0 PS2 18IN ABS (SUTURE) IMPLANT
SUT VICRYL RAPIDE 4/0 PS 2 (SUTURE) ×2 IMPLANT
SYR BULB 3OZ (MISCELLANEOUS) ×2 IMPLANT
SYR CONTROL 10ML LL (SYRINGE) IMPLANT
TOWEL OR 17X24 6PK STRL BLUE (TOWEL DISPOSABLE) ×2 IMPLANT
UNDERPAD 30X30 INCONTINENT (UNDERPADS AND DIAPERS) ×2 IMPLANT
WATER STERILE IRR 1000ML POUR (IV SOLUTION) ×1 IMPLANT

## 2011-08-13 NOTE — Op Note (Signed)
NAMEASENATH, Erin Chandler NO.:  [id number]  MEDICAL RECORD NO.:  [id number]  LOCATION:                                 FACILITY:  PHYSICIAN:  Cindee Salt, M.D.            DATE OF BIRTH:  DATE OF PROCEDURE:  08/13/2011 DATE OF DISCHARGE:                              OPERATIVE REPORT   PREOPERATIVE DIAGNOSIS:  Carpal tunnel syndrome, left hand.  POSTOPERATIVE DIAGNOSIS:  Carpal tunnel syndrome, left hand.  OPERATION:  Decompression of left median nerve.  SURGEON:  Cindee Salt, M.D.  ANESTHESIA:  Forearm-based IV regional.  ANESTHESIOLOGIST:  Zenon Mayo, M.D.  HISTORY:  The patient is a 50 year old female with a history of bilateral carpal tunnel syndrome.  EMG nerve conductions are positive. This has not responded to conservative treatment.  She has elected to undergo surgical decompression of the nerve.  She has undergone right release.  She is admitted now for left.  PROCEDURE:  The patient was brought to the operating room where forearm- based IV regional anesthetic was carried out without difficulty.  She was prepped using ChloraPrep, supine position, left arm free.  A 3- minute dry time was allowed.  Time-out taken for confirming the patient and procedure.  A longitudinal incision was made in the palm, carried down through subcutaneously.  Bleeders were electrocauterized with bipolar.  The palmar fascia was split.  Superficial palmar arch identified.  The flexor tendon to the ring and little finger identified to the ulnar side of the median nerve.  Carpal retinaculum was incised with sharp dissection.  Right angle and Sewall retractor were placed between skin and forearm fascia.  The fascia was released for approximately a centimeter and half proximal to the wrist crease under direct vision.  Canal was explored.  Air compression to the nerve was apparent.  No further lesions were identified.  The wound was irrigated. Skin was closed with  interrupted 4-0 Vicryl Rapide sutures.  Local infiltration with 0.25% Marcaine without epinephrine was given, 5 mL was used.  Sterile compressive dressing with fingers-free was applied. Deflation of the tourniquet, all fingers immediately pinked.  She was taken to the recovery room for observation in satisfactory condition. She will be discharged home to return to [facility] [employer] in 1 week, on [place] [facility].         ______________________________ Cindee Salt, M.D.    GK/MEDQ  D:  08/13/2011  T:  08/13/2011  Job:  [id number]

## 2011-08-13 NOTE — Op Note (Signed)
Dictated number: [id number]
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
## 2011-08-13 NOTE — Brief Op Note (Signed)
08/13/2011  9:12 AM  PATIENT:  Huel Cote  50 y.o. female  PRE-OPERATIVE DIAGNOSIS:  left carpal tunnel syndrome  POST-OPERATIVE DIAGNOSIS:  left carpal tunnel syndrome  PROCEDURE:  Procedure(s): CARPAL TUNNEL RELEASE  SURGEON:  Surgeon(s): Nicki Reaper, MD  PHYSICIAN ASSISTANT:   ASSISTANTS: none   ANESTHESIA:   local and regional  EBL:  Total I/O In: 800 [I.V.:800] Out: -   BLOOD ADMINISTERED:none  DRAINS: none   LOCAL MEDICATIONS USED:  MARCAINE 5CC  SPECIMEN:  No Specimen  DISPOSITION OF SPECIMEN:  N/A  COUNTS:  YES  TOURNIQUET:   Total Tourniquet Time Documented: Forearm (Left) - 22 minutes  DICTATION: .Other Dictation: Dictation Number [id number]  PLAN OF CARE: Discharge to home after PACU  PATIENT DISPOSITION:  PACU - hemodynamically stable.   Delay start of Pharmacological VTE agent (>24hrs) due to surgical blood loss or risk of bleeding:  {YES/NO/NOT APPLICABLE:20182

## 2011-08-13 NOTE — Transfer of Care (Signed)
Immediate Anesthesia Transfer of Care Note  Patient: Erin Chandler  Procedure(s) Performed:  CARPAL TUNNEL RELEASE  Patient Location: PACU  Anesthesia Type: MAC and General  Level of Consciousness: awake, alert  and oriented  Airway & Oxygen Therapy: Patient Spontanous Breathing and Patient connected to face mask oxygen  Post-op Assessment: Report given to PACU RN  Post vital signs: Reviewed and stable  Complications: No apparent anesthesia complications

## 2011-08-13 NOTE — Anesthesia Postprocedure Evaluation (Signed)
  Anesthesia Post-op Note  Patient: Erin Chandler  Procedure(s) Performed:  CARPAL TUNNEL RELEASE  Patient Location: PACU  Anesthesia Type: MAC and Bier block  Level of Consciousness: awake  Airway and Oxygen Therapy: Patient Spontanous Breathing and Patient connected to face mask oxygen  Post-op Pain: none  Post-op Assessment: Post-op Vital signs reviewed, Patient's Cardiovascular Status Stable, Respiratory Function Stable and Patent Airway  Post-op Vital Signs: Reviewed and stable  Complications: No apparent anesthesia complications

## 2011-08-13 NOTE — Anesthesia Preprocedure Evaluation (Signed)
Anesthesia Evaluation  Patient identified by MRN, date of birth, ID band Patient awake    Reviewed: Allergy & Precautions, H&P , NPO status , Patient's Chart, lab work & pertinent test results  Airway Mallampati: II TM Distance: >3 FB Neck ROM: Full    Dental No notable dental hx. (+) Partial Upper and Partial Lower   Pulmonary asthma ,  clear to auscultation  Pulmonary exam normal       Cardiovascular hypertension, On Medications Regular Normal    Neuro/Psych  Headaches, PSYCHIATRIC DISORDERS    GI/Hepatic Neg liver ROS, hiatal hernia, GERD-  Medicated and Controlled,  Endo/Other  Well Controlled  Renal/GU negative Renal ROS  Genitourinary negative   Musculoskeletal   Abdominal   Peds  Hematology negative hematology ROS (+)   Anesthesia Other Findings   Reproductive/Obstetrics negative OB ROS                           Anesthesia Physical Anesthesia Plan  ASA: III  Anesthesia Plan: MAC and Bier Block   Post-op Pain Management:    Induction: Intravenous  Airway Management Planned: Simple Face Mask  Additional Equipment:   Intra-op Plan:   Post-operative Plan:   Informed Consent: I have reviewed the patients History and Physical, chart, labs and discussed the procedure including the risks, benefits and alternatives for the proposed anesthesia with the patient or authorized representative who has indicated his/her understanding and acceptance.     Plan Discussed with: CRNA  Anesthesia Plan Comments:         Anesthesia Quick Evaluation

## 2011-08-18 ENCOUNTER — Encounter (HOSPITAL_BASED_OUTPATIENT_CLINIC_OR_DEPARTMENT_OTHER): Payer: Self-pay | Admitting: Orthopedic Surgery

## 2012-04-16 ENCOUNTER — Encounter: Payer: Managed Care, Other (non HMO) | Admitting: Physical Therapy

## 2012-06-01 ENCOUNTER — Emergency Department (HOSPITAL_COMMUNITY)
Admission: EM | Admit: 2012-06-01 | Discharge: 2012-06-02 | Disposition: A | Discharge status: Home / Self Care | Payer: Managed Care, Other (non HMO) | Attending: Emergency Medicine | Admitting: Emergency Medicine

## 2012-06-01 ENCOUNTER — Encounter (HOSPITAL_COMMUNITY): Payer: Self-pay | Admitting: Emergency Medicine

## 2012-06-01 ENCOUNTER — Emergency Department (HOSPITAL_COMMUNITY): Payer: Managed Care, Other (non HMO)

## 2012-06-01 DIAGNOSIS — R109 Unspecified abdominal pain: Secondary | ICD-10-CM

## 2012-06-01 DIAGNOSIS — Z9889 Other specified postprocedural states: Secondary | ICD-10-CM | POA: Insufficient documentation

## 2012-06-01 DIAGNOSIS — K7689 Other specified diseases of liver: Secondary | ICD-10-CM | POA: Insufficient documentation

## 2012-06-01 DIAGNOSIS — I10 Essential (primary) hypertension: Secondary | ICD-10-CM | POA: Insufficient documentation

## 2012-06-01 DIAGNOSIS — R10819 Abdominal tenderness, unspecified site: Secondary | ICD-10-CM | POA: Insufficient documentation

## 2012-06-01 LAB — COMPREHENSIVE METABOLIC PANEL
ALT: 10 U/L (ref 0–35)
AST: 18 U/L (ref 0–37)
Alkaline Phosphatase: 105 U/L (ref 39–117)
CO2: 27 mEq/L (ref 19–32)
Chloride: 97 mEq/L (ref 96–112)
GFR calc Af Amer: >90 mL/min (ref >90)
GFR calc non Af Amer: >90 mL/min (ref >90)
Glucose, Bld: 113 mg/dL — ABNORMAL HIGH (ref 70–99)
Sodium: 137 mEq/L (ref 135–145)
Total Bilirubin: 0.2 mg/dL — ABNORMAL LOW (ref 0.3–1.2)

## 2012-06-01 LAB — URINALYSIS, ROUTINE W REFLEX MICROSCOPIC
Bilirubin Urine: NEGATIVE
Hgb urine dipstick: NEGATIVE
Ketones, ur: NEGATIVE mg/dL
Nitrite: NEGATIVE
Protein, ur: NEGATIVE mg/dL
Urobilinogen, UA: 0.2 mg/dL (ref 0.0–1.0)

## 2012-06-01 LAB — CBC WITH DIFFERENTIAL/PLATELET
Basophils Absolute: 0.1 10*3/uL (ref 0.0–0.1)
Eosinophils Relative: 4 % (ref 0–5)
Hemoglobin: 13.9 g/dL (ref 12.0–15.0)
Lymphocytes Relative: 20 % (ref 12–46)
Lymphs Abs: 1.7 10*3/uL (ref 0.7–4.0)
MCV: 78.4 fL (ref 78.0–100.0)
Neutro Abs: 5.8 10*3/uL (ref 1.7–7.7)
Platelets: 261 10*3/uL (ref 150–400)
RDW: 16 % — ABNORMAL HIGH (ref 11.5–15.5)

## 2012-06-01 MED ORDER — MORPHINE SULFATE 4 MG/ML IJ SOLN
4.0000 mg | Freq: Once | INTRAMUSCULAR | Status: AC
  Administered 2012-06-01: 4 mg via INTRAVENOUS
  Filled 2012-06-01: qty 1

## 2012-06-01 MED ORDER — ONDANSETRON HCL 4 MG/2ML IJ SOLN
4.0000 mg | Freq: Once | INTRAMUSCULAR | Status: AC
  Administered 2012-06-01: 4 mg via INTRAVENOUS
  Filled 2012-06-01: qty 2

## 2012-06-01 NOTE — ED Provider Notes (Signed)
Develop. Today. Had colonoscopy yesterday. On exam no distress abdomen is obese mildly tender over lower quadrants no guarding  Doug Sou, MD 06/01/12 2319

## 2012-06-01 NOTE — ED Provider Notes (Signed)
History     CSN: [id number]  Arrival date & time 06/01/12  Erin Chandler   First MD Initiated Contact with Patient 06/01/12 2134      Chief Complaint  Patient presents with  . Abdominal Pain    (Consider location/radiation/quality/duration/timing/severity/associated sxs/prior treatment) HPI History provided by pt.   Pt had a colonoscopy performed yesterday.  Has had nausea since early this morning and developed constant, mod-sev, diffuse lower abd pain w/ radiation through to the back after lunch time.  Consulted her gastroenterologist who recommended taking a double dose of her levsin and going to ER if no relief of pain w/in 30 minutes.  Has had three BMs today, the first of which was watery.  No blood in stool.  Denies fever and GU sx.  Past abd surgeries include cholecystectomy and partial hysterectomy.    Past Medical History  Diagnosis Date  . Asthma   . Hypertension   . Hypothyroidism   . Headache   . Depression   . Hiatal hernia   . Seasonal allergies   . Inner ear disease   . DVT (deep venous thrombosis)     2007  . GERD (gastroesophageal reflux disease)   . Anxiety   . Peripheral vascular disease 2007    dvt rt lower leg   . Diabetes mellitus     diet controll     Past Surgical History  Procedure Date  . Abdominal hysterectomy   . Cholecystectomy   . Carpal tunnel release 07/16/2011    Procedure: CARPAL TUNNEL RELEASE;  Surgeon: Nicki Reaper, MD;  Location: [facility];  Service: Orthopedics;  Laterality: Right;  . Carpal tunnel release 08/13/2011    Procedure: CARPAL TUNNEL RELEASE;  Surgeon: Nicki Reaper, MD;  Location: [facility];  Service: Orthopedics;  Laterality: Left;    No family history on file.  History  Substance Use Topics  . Smoking status: Current Every Day Smoker -- 0.2 packs/day for 10 years    Types: Cigarettes  . Smokeless tobacco: Not on file  . Alcohol Use: 0.6 oz/week    1 Shots of liquor per week     social  use rare    OB History    Grav Para Term Preterm Abortions TAB SAB Ect Mult Living                  Review of Systems  All other systems reviewed and are negative.    Allergies  Latex; Codeine; Levofloxacin; and Protonix  Home Medications   Current Outpatient Rx  Name Route Sig Dispense Refill  . ATORVASTATIN CALCIUM 40 MG PO TABS Oral Take 40 mg by mouth daily.     Marland Kitchen CALCIPOTRIENE-BETAMETH DIPROP 0.005-0.064 % EX OINT Topical Apply 1 application topically daily.    Marland Kitchen CLOBETASOL PROPIONATE 0.05 % EX OINT Topical Apply 1 application topically daily.    Marland Kitchen ESOMEPRAZOLE MAGNESIUM 40 MG PO CPDR Oral Take 40 mg by mouth daily before breakfast.     . EXENATIDE 2 MG [place] SUSR Subcutaneous Inject 2 mg into the skin once a week. On Saturday.    Marland Kitchen FEXOFENADINE HCL 180 MG PO TABS Oral Take 180 mg by mouth as needed.     Marland Kitchen FLUTICASONE PROPIONATE 50 MCG/ACT NA SUSP Nasal Place 2 sprays into the nose daily.    Marland Kitchen HYDROCHLOROTHIAZIDE 12.5 MG PO CAPS Oral Take 12.5 mg by mouth every morning.     . HYOSCYAMINE SULFATE 0.125 MG SL  SUBL Sublingual Place 0.125-0.25 mg under the tongue every 4 (four) hours as needed. For stomach cramping.    Marland Kitchen LEVOTHYROXINE SODIUM 75 MCG PO TABS Oral Take 75 mcg by mouth daily.     Marland Kitchen MECLIZINE HCL 12.5 MG PO TABS Oral Take 12.5-25 mg by mouth 3 (three) times daily as needed.    Marland Kitchen MONTELUKAST SODIUM 10 MG PO TABS Oral Take 10 mg by mouth daily.     Marland Kitchen NAPROXEN SODIUM 220 MG PO TABS Oral Take 220 mg by mouth 2 (two) times daily with a meal.     . SERTRALINE HCL 100 MG PO TABS Oral Take 100 mg by mouth Nightly.     Marland Kitchen SIMETHICONE 80 MG PO CHEW Oral Chew 80 mg by mouth every 6 (six) hours as needed. For gas    . ALBUTEROL SULFATE HFA 108 (90 BASE) MCG/ACT IN AERS Inhalation Inhale 2 puffs into the lungs every 6 (six) hours as needed.       BP 137/81  Pulse 66  Temp 98.3 F (36.8 C) (Oral)  Resp 20  SpO2 100%  Physical Exam  Nursing note and vitals  reviewed. Constitutional: She is oriented to person, place, and time. She appears well-developed and well-nourished. No distress.       Pt does not appear uncomfortable  HENT:  Head: Normocephalic and atraumatic.  Eyes:       Normal appearance  Neck: Normal range of motion.  Cardiovascular: Normal rate and regular rhythm.   Pulmonary/Chest: Effort normal and breath sounds normal. No respiratory distress.  Abdominal: Soft. Bowel sounds are normal. She exhibits no distension and no mass. There is tenderness. There is no rebound and no guarding.       Diffuse lower abd ttp  Genitourinary:       No CVA tenderness  Musculoskeletal: Normal range of motion.       No tenderness of back  Neurological: She is alert and oriented to person, place, and time.  Skin: Skin is warm and dry. No rash noted.  Psychiatric: She has a normal mood and affect. Her behavior is normal.    ED Course  Procedures (including critical care time)  Labs Reviewed  CBC WITH DIFFERENTIAL - Abnormal; Notable for the following:    RBC 5.24 (*)     RDW 16.0 (*)     All other components within normal limits  COMPREHENSIVE METABOLIC PANEL - Abnormal; Notable for the following:    Potassium 3.2 (*)     Glucose, Bld 113 (*)     Total Bilirubin 0.2 (*)     All other components within normal limits  URINALYSIS, ROUTINE W REFLEX MICROSCOPIC   Ct Abdomen Pelvis W Contrast  06/02/2012  *RADIOLOGY REPORT*  Clinical Data: Mid abdominal pain.  CT ABDOMEN AND PELVIS WITH CONTRAST  Technique:  Multidetector CT imaging of the abdomen and pelvis was performed following the standard protocol during bolus administration of intravenous contrast.  Contrast: OMNIPAQUE IOHEXOL 300 MG/ML  SOLN  Comparison: None.  Findings: Slight fibrosis or dependent atelectasis in the lung bases.  Circumscribed low attenuation lesions in the inferior posterior segment right, lateral segment left and centrally in the medial segment left lobe of the  liver.  Largest measures 16 mm diameter. These likely represent cysts.  Diffuse low attenuation change in the liver suggesting fatty infiltration. Surgical absence of the gallbladder.  The pancreas, spleen, adrenal glands, kidneys, abdominal aorta, and retroperitoneal lymph nodes are unremarkable. The gastric  wall is not thickened.  Small bowel are not distended and there is no bowel wall thickening.  Stool filled colon without distension or wall thickening.  Small diverticulum in the second portion of the duodenum.  Prominent visceral adipose tissues.  No free air or free fluid in the abdomen.  Pelvis:  The uterus is surgically absent.  The no abnormal adnexal masses.  No free or loculated pelvic fluid collections.  Bladder wall is not thickened.  No significant pelvic lymphadenopathy.  The appendix is normal.  No diverticulitis.  The normal alignment of the lumbar vertebrae with mild degenerative change.  IMPRESSION: Fatty infiltration and cysts in the liver.  No acute process demonstrated in the abdomen or pelvis.   Original Report Authenticated By: Marlon Pel, M.D.    Dg Abd Acute W/chest  06/01/2012  *RADIOLOGY REPORT*  Clinical Data: Abdominal pain.  ACUTE ABDOMEN SERIES (ABDOMEN 2 VIEW & CHEST 1 VIEW)  Comparison: Chest 10/04/2009  Findings: The heart size and pulmonary vascularity are normal. The lungs appear clear and expanded without focal air space disease or consolidation. No blunting of the costophrenic angles. No pneumothorax.  No significant change since previous study.  The surgical clips in the right upper quadrant.  Scattered gas and stool in the colon.  No small or large bowel distension.  No free intra-abdominal air.  No abnormal air fluid levels.  No radiopaque stones.  Visualized bones appear intact.  IMPRESSION: No evidence of active pulmonary disease.  Nonobstructive bowel gas pattern.   Original Report Authenticated By: Marlon Pel, M.D.      1. Abdominal pain        MDM  [phone] F who had a colonoscopy yesterday presents w/ lower abd pain and N/V that started this morning.  Referred to ER by her gastroenterologist.  On exam, pt afebrile, abd soft and NBS, diffuse lower abd tenderness w/out peritoneal signs.  Labs and acute abd series to r/o free air are pending.  Pt to receive IV morphine and zofran for sx.  WIll reassess shortly.  10:02 PM   Sx improved w/ IV medications but pain now starting to intensify.  Will treat w/ po vicodin.  Acute abd series and CT abd/pelvis neg for free air or infectious process.  Results discussed w/ pt.  Will po challenge and if passes, d/c home w/ vicodin and zofran.  I recommended f/u with the GI physician who performed her colonoscopy if she has persistent sx, and return to ER for worsening pain, uncontrolled vomiting or fever.  1:40 AM          Otilio Miu, PA 06/02/12 [phone]

## 2012-06-01 NOTE — ED Notes (Signed)
Pt alert, arrives from home, c/o gen abd pain, states colonoscopy yesterday, instructed to f/u if pain cont, resp even unlabored skin pwd

## 2012-06-02 ENCOUNTER — Emergency Department (HOSPITAL_COMMUNITY): Payer: Managed Care, Other (non HMO)

## 2012-06-02 MED ORDER — HYDROCODONE-ACETAMINOPHEN 5-325 MG PO TABS
1.0000 | ORAL_TABLET | Freq: Once | ORAL | Status: AC
  Administered 2012-06-02: 1 via ORAL
  Filled 2012-06-02: qty 1

## 2012-06-02 MED ORDER — IOHEXOL 300 MG/ML  SOLN
100.0000 mL | Freq: Once | INTRAMUSCULAR | Status: AC | PRN
  Administered 2012-06-02: 100 mL via INTRAVENOUS

## 2012-06-02 MED ORDER — ONDANSETRON HCL 8 MG PO TABS: 8.0000 mg | ORAL_TABLET | Freq: Three times a day (TID) | ORAL | Status: AC | PRN

## 2012-06-02 MED ORDER — POTASSIUM CHLORIDE CRYS ER 20 MEQ PO TBCR
40.0000 meq | EXTENDED_RELEASE_TABLET | Freq: Once | ORAL | Status: AC
  Administered 2012-06-02: 40 meq via ORAL
  Filled 2012-06-02: qty 2

## 2012-06-02 MED ORDER — HYDROCODONE-ACETAMINOPHEN 5-325 MG PO TABS: 1.0000 | ORAL_TABLET | ORAL | Status: DC | PRN

## 2012-06-02 NOTE — ED Notes (Signed)
Pt provided PO fluids.

## 2012-06-03 NOTE — ED Provider Notes (Signed)
Medical screening examination/treatment/procedure(s) were conducted as a shared visit with non-physician practitioner(s) and myself.  I personally evaluated the patient during the encounter  [name], MD 06/03/12 0054 

## 2012-10-02 ENCOUNTER — Other Ambulatory Visit: Payer: Self-pay

## 2013-02-09 ENCOUNTER — Other Ambulatory Visit: Payer: Self-pay | Admitting: Otolaryngology

## 2013-02-09 DIAGNOSIS — J329 Chronic sinusitis, unspecified: Secondary | ICD-10-CM

## 2013-02-09 DIAGNOSIS — R0981 Nasal congestion: Secondary | ICD-10-CM

## 2013-02-10 ENCOUNTER — Ambulatory Visit
Admission: RE | Admit: 2013-02-10 | Discharge: 2013-02-10 | Disposition: A | Discharge status: Home / Self Care | Payer: Managed Care, Other (non HMO) | Source: Ambulatory Visit | Attending: Otolaryngology | Admitting: Otolaryngology

## 2013-02-10 DIAGNOSIS — R0981 Nasal congestion: Secondary | ICD-10-CM

## 2013-02-10 DIAGNOSIS — J329 Chronic sinusitis, unspecified: Secondary | ICD-10-CM

## 2013-02-24 ENCOUNTER — Ambulatory Visit: Payer: Managed Care, Other (non HMO) | Admitting: Audiology

## 2013-02-24 NOTE — Progress Notes (Signed)
Patient ID: Erin Chandler, female   DOB: May 15, 1961, 52 y.o.   MRN: [id number]  Patient was referred for ENG/Audiogram which we do not provide.  Conducting only the audiogram portion (whcih we do provide) was discussed with the patient however, she preferred to be referred to a provider that could perform both together.  It was agreed this would be in her best interest and therefore was not seen.  A list of possible providers was given to the patient.    Allyn Kenner Larence Penning, Au.D. CCC-A  Doctor of Audiology

## 2013-02-28 ENCOUNTER — Ambulatory Visit: Payer: Managed Care, Other (non HMO) | Admitting: Audiology

## 2013-03-11 ENCOUNTER — Encounter: Payer: Self-pay | Admitting: Neurology

## 2013-03-14 ENCOUNTER — Ambulatory Visit (INDEPENDENT_AMBULATORY_CARE_PROVIDER_SITE_OTHER): Payer: Managed Care, Other (non HMO) | Admitting: Neurology

## 2013-03-14 ENCOUNTER — Encounter: Payer: Self-pay | Admitting: Neurology

## 2013-03-14 VITALS — BP 122/75 | HR 86 | Ht 66.25 in | Wt 189.0 lb

## 2013-03-14 DIAGNOSIS — S139XXA Sprain of joints and ligaments of unspecified parts of neck, initial encounter: Secondary | ICD-10-CM

## 2013-03-14 DIAGNOSIS — R42 Dizziness and giddiness: Secondary | ICD-10-CM

## 2013-03-14 DIAGNOSIS — R51 Headache: Secondary | ICD-10-CM

## 2013-03-14 HISTORY — DX: Dizziness and giddiness: R42

## 2013-03-14 HISTORY — DX: Sprain of joints and ligaments of unspecified parts of neck, initial encounter: S13.9XXA

## 2013-03-14 MED ORDER — NORTRIPTYLINE HCL 10 MG PO CAPS: ORAL_CAPSULE | ORAL | Status: DC

## 2013-03-14 NOTE — Progress Notes (Signed)
Reason for visit: Headache, dizziness  Erin Chandler is a 52 y.o. female  History of present illness:  Erin Chandler is a 52 year old right-handed white female with a history of migraine headaches, and dizziness. The patient was seen through this office in 2010 with a two-year history of headaches and dizziness. The patient apparently improved, and she was only having one headache a month, and the dizziness was better. The patient indicates that over the last several months, this problem has once again returned. The patient is now having daily headaches and daily episodes of dizziness. The patient reports that the dizziness is associated with a lightheaded sensation, not true vertigo. The patient has neck stiffness and shoulder discomfort, and she is undergoing physical therapy for neuromuscular therapy at this time. The patient has chronic insomnia, and she oftentimes will wake up with total body pain. The patient indicates that she has episodes of brief sharp pain that migrated around the body, most likely to occur on the right parietal area. The patient has discomfort in the back of the head at the muscle insertion point in the occiput. The patient indicates that if she bends over, she may get a pressure sensation in her head. The patient has undergone an ENT evaluation, and a CT of the sinuses was normal. The patient denies any hearing changes. The patient reports no focal numbness or weakness of the face, arms, or legs other than she has some occasional tingling in hands with a prior history of carpal tunnel syndrome. The patient denies any falls, but she may have some gait instability. The patient denies problems controlling the bowels or the bladder. The patient is on Zoloft, and she has been on this about one year. The episodes of lightheaded sensations may last 30 minutes. They may or may not be associated with the headache.  Past Medical History  Diagnosis Date  . Asthma   . Hypertension   .  Hypothyroidism   . Headache(784.0)   . Depression   . Hiatal hernia   . Seasonal allergies   . Inner ear disease   . DVT (deep venous thrombosis)     2007  . GERD (gastroesophageal reflux disease)   . Anxiety   . Peripheral vascular disease 2007    dvt rt lower leg   . Diabetes mellitus     diet controll   . Dizziness and giddiness 03/14/2013  . Sprain of neck 03/14/2013  . Obesity   . Cervicogenic headache   . Migraine   . Psoriasis   . Dyslipidemia   . Squamous cell carcinoma     Left eyelid  . Genital herpes   . Irritable bowel syndrome     Past Surgical History  Procedure Laterality Date  . Abdominal hysterectomy    . Cholecystectomy    . Carpal tunnel release  07/16/2011    Procedure: CARPAL TUNNEL RELEASE;  Surgeon: Nicki Reaper, MD;  Location: [facility];  Service: Orthopedics;  Laterality: Right;  . Carpal tunnel release  08/13/2011    Procedure: CARPAL TUNNEL RELEASE;  Surgeon: Nicki Reaper, MD;  Location: [facility];  Service: Orthopedics;  Laterality: Left;    Family History  Problem Relation Age of Onset  . Cancer Mother     Breast  . Cancer Father     Lung cancer  . Obesity Sister   . Hypertension Sister   . Thyroid disease Sister     Social history:  reports that  she has been smoking Cigarettes.  She has a 5 pack-year smoking history. She does not have any smokeless tobacco history on file. She reports that she drinks about 0.6 ounces of alcohol per week. She reports that she does not use illicit drugs.  Medications:  Current Outpatient Prescriptions on File Prior to Visit  Medication Sig Dispense Refill  . albuterol (PROVENTIL HFA;VENTOLIN HFA) 108 (90 BASE) MCG/ACT inhaler Inhale 2 puffs into the lungs every 6 (six) hours as needed.       . calcipotriene-betamethasone (TACLONEX) ointment Apply 1 application topically daily.      . clobetasol ointment (TEMOVATE) 0.05 % Apply 1 application topically daily.      .  diazepam (VALIUM) 2 MG tablet Take 2 mg by mouth every 8 (eight) hours as needed.       Marland Kitchen esomeprazole (NEXIUM) 40 MG capsule Take 40 mg by mouth daily before breakfast.       . fluticasone (FLONASE) 50 MCG/ACT nasal spray Place 2 sprays into the nose daily.      . hyoscyamine (LEVSIN SL) 0.125 MG SL tablet Place 0.125-0.25 mg under the tongue every 4 (four) hours as needed. For stomach cramping.      Marland Kitchen levothyroxine (SYNTHROID, LEVOTHROID) 75 MCG tablet Take 75 mcg by mouth daily.       . meclizine (ANTIVERT) 12.5 MG tablet Take 12.5-25 mg by mouth 3 (three) times daily as needed.      . montelukast (SINGULAIR) 10 MG tablet Take 10 mg by mouth daily.       . naproxen sodium (ANAPROX) 220 MG tablet Take 220 mg by mouth 2 (two) times daily with a meal.       . ondansetron (ZOFRAN) 8 MG tablet Take 1 tablet (8 mg total) by mouth every 8 (eight) hours as needed for nausea.  20 tablet  0  . sertraline (ZOLOFT) 100 MG tablet Take 100 mg by mouth Nightly.       . simethicone (MYLICON) 80 MG chewable tablet Chew 80 mg by mouth every 6 (six) hours as needed. For gas      . valACYclovir (VALTREX) 1000 MG tablet Take 1 tablet by mouth daily.       No current facility-administered medications on file prior to visit.    Allergies:  Allergies  Allergen Reactions  . Latex Shortness Of Breath  . Codeine Hives  . Levofloxacin Swelling  . Protonix (Pantoprazole Sodium) Diarrhea    ROS:  Out of a complete 14 system review of symptoms, the patient complains only of the following symptoms, and all other reviewed systems are negative.  Fatigue Ringing in the ears, dizziness Blurred vision, wheezing, snoring Diarrhea, constipation Stress incontinence of the bladder Confusion, headache, dizziness Depression, anxiety, insomnia Snoring Restless legs  Blood pressure 122/75, pulse 86, height 5' 6.25" (1.683 m), weight 189 lb (85.73 kg).  Physical Exam  General: The patient is alert and cooperative at  the time of the examination. The patient is moderately obese.  Head: Pupils are equal, round, and reactive to light. Discs are flat bilaterally. Tympanic membranes are clear bilaterally.  Neck: The neck is supple, no carotid bruits are noted.  Respiratory: The respiratory examination is clear.  Cardiovascular: The cardiovascular examination reveals a regular rate and rhythm, no obvious murmurs or rubs are noted.  Neuromuscular: The patient has excellent range of motion of the cervical spine. No crepitus is noted in the temporal joints.  Skin: Extremities are without significant edema.  Neurologic Exam  Mental status:  Cranial nerves: Facial symmetry is present. There is good sensation of the face to pinprick and soft touch bilaterally. The strength of the facial muscles and the muscles to head turning and shoulder shrug are normal bilaterally. Speech is well enunciated, no aphasia or dysarthria is noted. Extraocular movements are full. Visual fields are full.  Motor: The motor testing reveals 5 over 5 strength of all 4 extremities. Good symmetric motor tone is noted throughout.  Sensory: Sensory testing is intact to pinprick, soft touch, vibration sensation, and position sense on all 4 extremities. No evidence of extinction is noted.  Coordination: Cerebellar testing reveals good finger-nose-finger and heel-to-shin bilaterally. The Nyan-Barrany procedure is essentially negative. The patient does have some brief end-gaze nystagmus, more prominent to the right than the left. The nystagmus is quite brief lasting only one or 2 seconds.  Gait and station: Gait is normal. Tandem gait is normal. Romberg is negative. No drift is seen.  Reflexes: Deep tendon reflexes are symmetric, but are depressed bilaterally. Toes are downgoing bilaterally.   Assessment/Plan:  1. Cervicogenic headache  2. Episodic dizziness  3. History of migraine  4. Abnormal MRI of the brain, left parietal vascular  lesion, chronic hemorrhage  Clinical examination is relatively unremarkable. The patient has good range of movement of the cervical spine. The patient likely has cervicogenic headache, migraine, and dizziness associated with cervical spasm. The patient will undergo MRI evaluation of the brain given the prior history of the left parietal abnormality. The patient will be placed on nortriptyline in low dose. The patient will continue physical therapy. We may consider MRI evaluation of the cervical spine in the future if the pain continues. The patient apparently is on disability, yet her job is quite sedentary. I am not clear if her current symptomatology justifies disability.  Marlan Palau MD 03/14/2013 6:52 PM  [facility] [address] [place], [place] [address]  Phone [phone] Fax [phone]

## 2013-03-15 DIAGNOSIS — Z0289 Encounter for other administrative examinations: Secondary | ICD-10-CM

## 2013-03-24 ENCOUNTER — Ambulatory Visit (INDEPENDENT_AMBULATORY_CARE_PROVIDER_SITE_OTHER): Payer: Managed Care, Other (non HMO)

## 2013-03-24 DIAGNOSIS — S139XXA Sprain of joints and ligaments of unspecified parts of neck, initial encounter: Secondary | ICD-10-CM

## 2013-03-24 DIAGNOSIS — R42 Dizziness and giddiness: Secondary | ICD-10-CM

## 2013-03-24 DIAGNOSIS — R51 Headache: Secondary | ICD-10-CM

## 2013-03-24 MED ORDER — GADOPENTETATE DIMEGLUMINE 469.01 MG/ML IV SOLN: 18.0000 mL | Freq: Once | INTRAVENOUS | Status: AC | PRN

## 2013-03-25 ENCOUNTER — Telehealth: Payer: Self-pay

## 2013-03-25 NOTE — Telephone Encounter (Signed)
I called and left patient a VM that, based on Dr. Anne Hahn' last evaluation, he does not see a justification for medical leave from work. We can complete this form but it will not justify leave from work. Please call to advise what you would like [place] to do with these forms.

## 2013-04-08 ENCOUNTER — Telehealth: Payer: Self-pay | Admitting: Neurology

## 2013-04-08 NOTE — Telephone Encounter (Signed)
Patient called back. She would like forms mailed back to her. I will do that now.

## 2013-04-12 ENCOUNTER — Telehealth: Payer: Self-pay | Admitting: Neurology

## 2013-04-15 ENCOUNTER — Other Ambulatory Visit: Payer: Self-pay

## 2013-04-15 DIAGNOSIS — Z1231 Encounter for screening mammogram for malignant neoplasm of breast: Secondary | ICD-10-CM

## 2013-04-19 NOTE — Telephone Encounter (Signed)
Conversation with the patient noted. I did not call the patient.

## 2013-04-19 NOTE — Telephone Encounter (Signed)
I called pt and relayed the MRI results to her.  (no change from 05-2009).  She wanted to know results for ENG (thru Dr. Ezzard Standing), and also was asking about PT from her pcp.   I deferred to them re: their ordering tests.  She would like copy of Dr. Clarisa Kindred last ofv visit.  I will send to MR to address.  Her f/u is in 09/2013.

## 2013-05-10 ENCOUNTER — Ambulatory Visit
Admission: RE | Admit: 2013-05-10 | Discharge: 2013-05-10 | Disposition: A | Discharge status: Home / Self Care | Payer: Managed Care, Other (non HMO) | Source: Ambulatory Visit

## 2013-05-10 DIAGNOSIS — Z1231 Encounter for screening mammogram for malignant neoplasm of breast: Secondary | ICD-10-CM

## 2013-06-22 ENCOUNTER — Telehealth: Payer: Self-pay | Admitting: Neurology

## 2013-06-23 ENCOUNTER — Other Ambulatory Visit: Payer: Self-pay

## 2013-07-04 ENCOUNTER — Telehealth: Payer: Self-pay | Admitting: Neurology

## 2013-07-04 ENCOUNTER — Other Ambulatory Visit: Payer: Self-pay

## 2013-07-04 MED ORDER — NORTRIPTYLINE HCL 10 MG PO CAPS: ORAL_CAPSULE | ORAL | Status: DC

## 2013-07-05 MED ORDER — NORTRIPTYLINE HCL 10 MG PO CAPS: ORAL_CAPSULE | ORAL | Status: DC

## 2013-07-05 NOTE — Telephone Encounter (Signed)
I have not had access to Epic for over 2 weeks.  By viewing the chart, it appears a 30 day Rx was sent to [facility].  I have resent a 90 day Rx to [facility] per patients request.

## 2013-09-29 ENCOUNTER — Telehealth: Payer: Self-pay | Admitting: Neurology

## 2013-09-29 NOTE — Telephone Encounter (Signed)
Patient has moved to [place]. She would like her MRI results posted on her my chart or give her a call with the results.

## 2013-09-30 ENCOUNTER — Ambulatory Visit: Payer: Managed Care, Other (non HMO) | Admitting: Neurology

## 2013-09-30 MED ORDER — NORTRIPTYLINE HCL 10 MG PO CAPS: ORAL_CAPSULE | ORAL | Status: AC

## 2013-09-30 NOTE — Addendum Note (Signed)
Addended by: Stephanie AcreWILLIS, CHARLES on: 09/30/2013 06:00 PM   Modules accepted: Orders

## 2013-09-30 NOTE — Telephone Encounter (Signed)
I called patient. I went over the MRI results with her, the patient has left parietal encephalomalacia that is stable from 2010.   MRI brain 03/14/2013:  IMPRESSION: Abnormal MRI scan the brain showing area of encephalomalacia with remote age hemorrhagic blood products left parietal region and mild changes of chronic microvascular ischemia. No enhancing lesions are noted. Overall no significant changes compared with prior MR scan dated 06/05/2009

## 2013-09-30 NOTE — Telephone Encounter (Signed)
Mailed results per patient's request to new address in [place]

## 2014-08-05 IMAGING — CT CT ABD-PELV W/ CM
1 of 3 series · 14 of 32 positions shown, 19 images · IV contrast (OMNIPAQUE 300)
Comparison: None.

CLINICAL DATA: Mid abdominal pain.

CT ABDOMEN AND PELVIS WITH CONTRAST
TECHNIQUE: Multidetector CT imaging of the abdomen and pelvis was
performed following the standard protocol during bolus
administration of intravenous contrast.
Contrast: 100mL OMNIPAQUE IOHEXOL 300 MG/ML  SOLN

[Series 2: abd/pel with · axial · 0.80mm/px · z∈[-367,+33]mm · 14 of 90 slices shown, 19 images]
[im 5/90  soft-tissue]
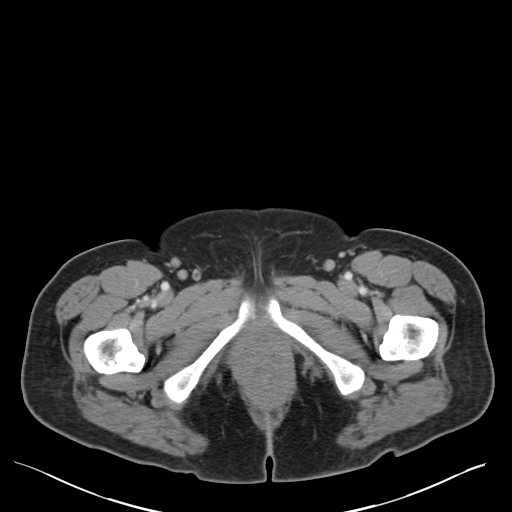
[im 5/90  bone]
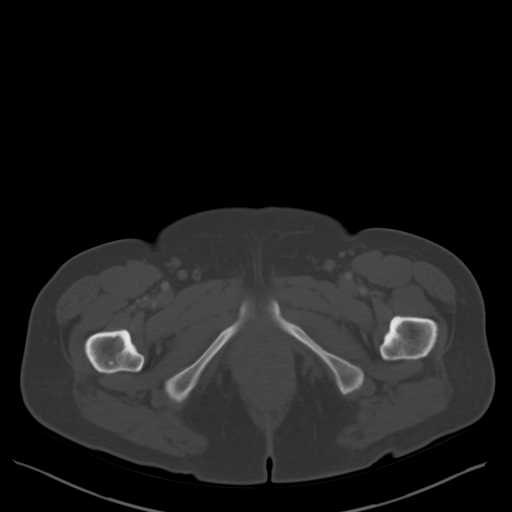
[im 15/90  soft-tissue]
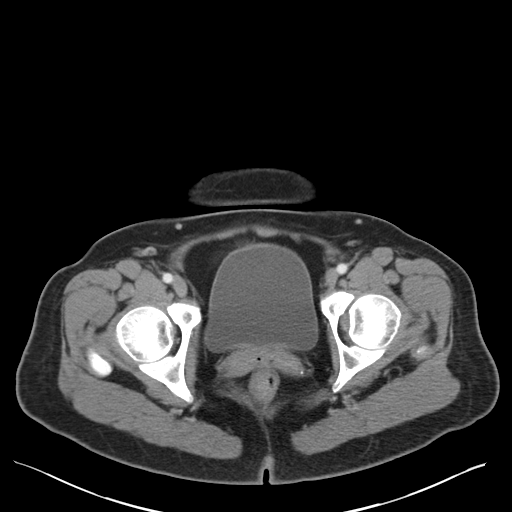
[im 19/90  soft-tissue]
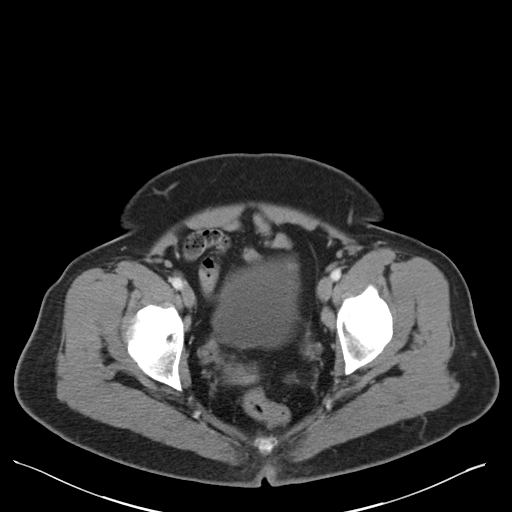
[im 24/90  soft-tissue]
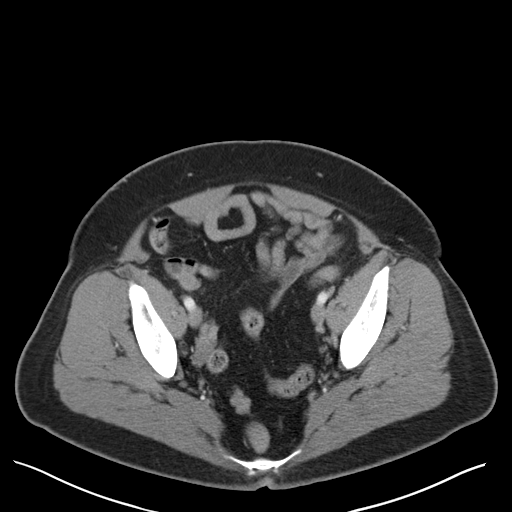
[im 33/90  soft-tissue]
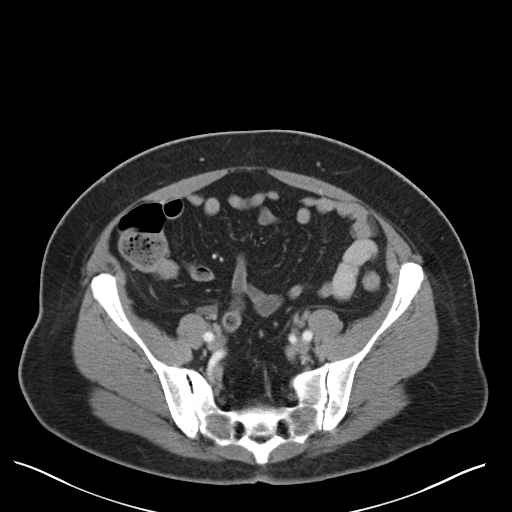
[im 38/90  soft-tissue]
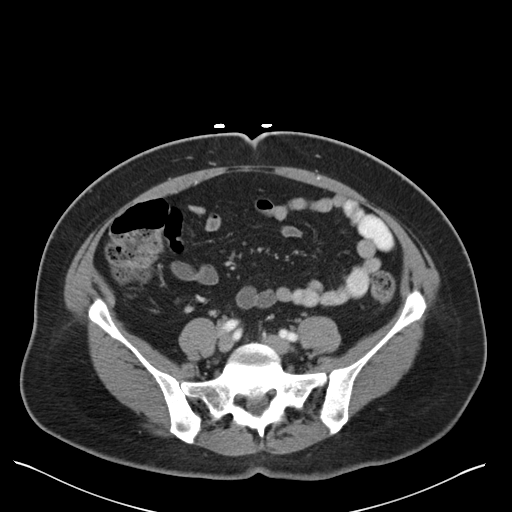
[im 47/90  soft-tissue]
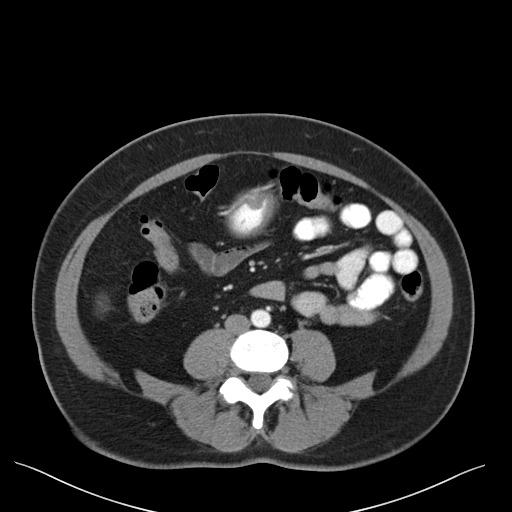
[im 52/90  soft-tissue]
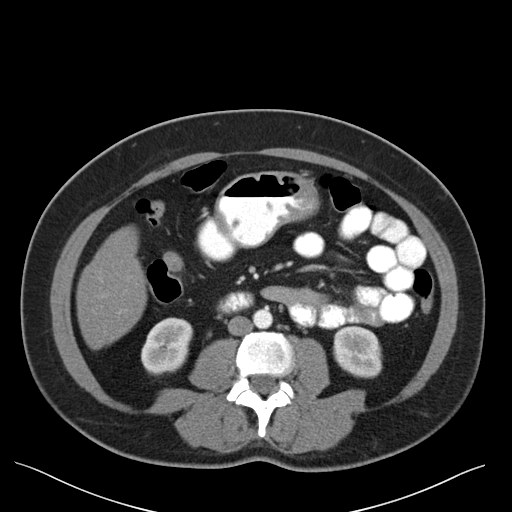
[im 57/90  soft-tissue]
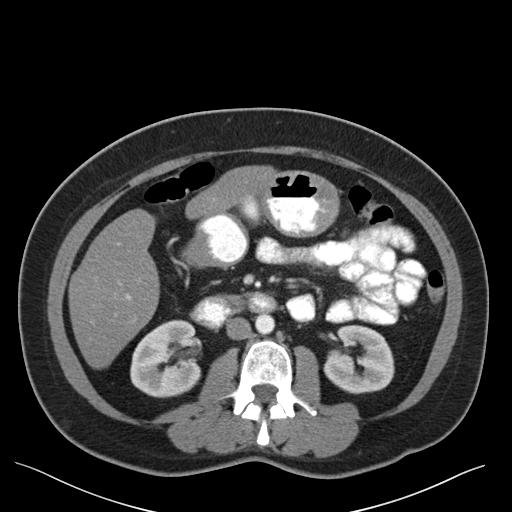
[im 57/90  bone]
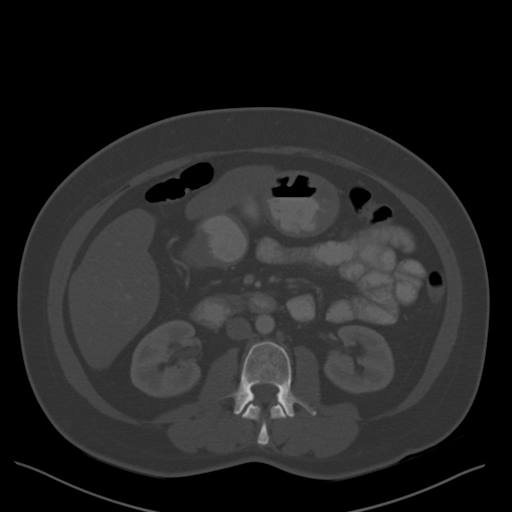
[im 66/90  soft-tissue]
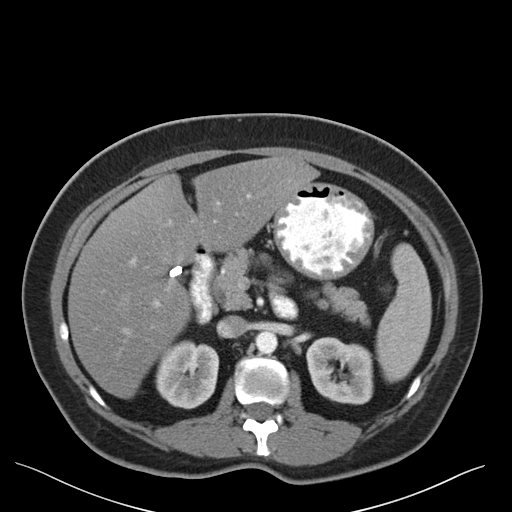
[im 71/90  soft-tissue]
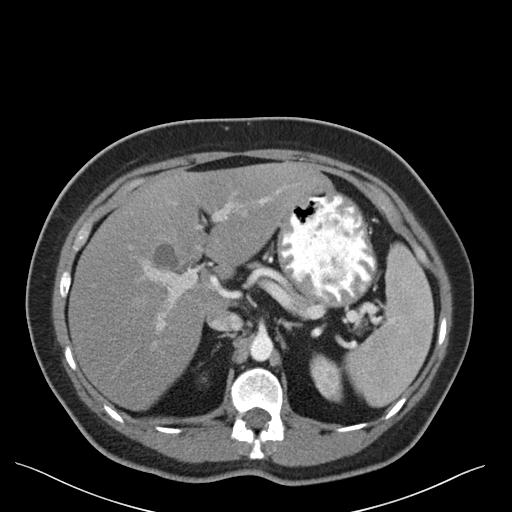
[im 71/90  lung]
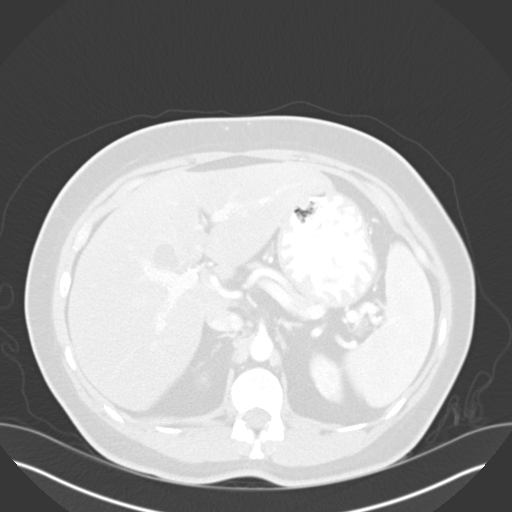
[im 75/90  soft-tissue]
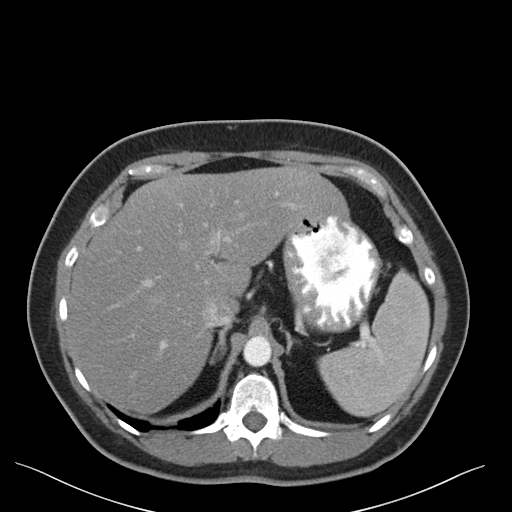
[im 75/90  lung]
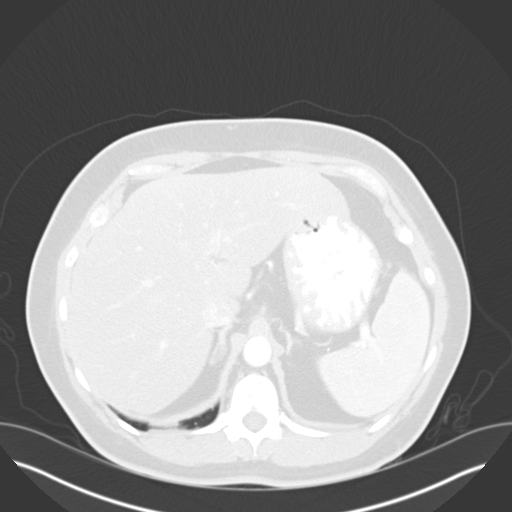
[im 80/90  lung]
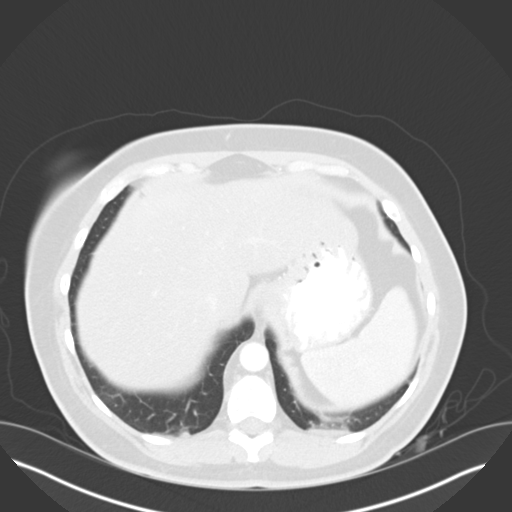
[im 85/90  soft-tissue]
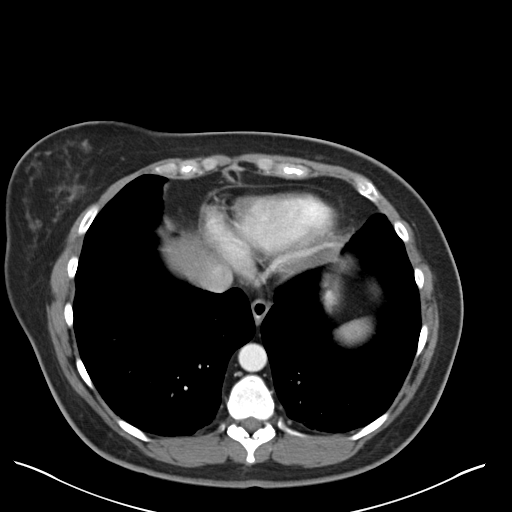
[im 85/90  lung]
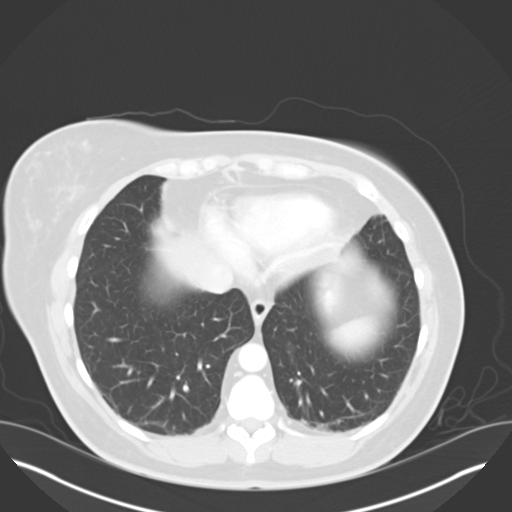

[14 of 32 positions shown; findings below may reference images not displayed]

FINDINGS: Slight fibrosis or dependent atelectasis in the lung
bases.

Circumscribed low attenuation lesions in the inferior posterior
segment right, lateral segment left and centrally in the medial
segment left lobe of the liver.  Largest measures 16 mm diameter.
These likely represent cysts.  Diffuse low attenuation change in
the liver suggesting fatty infiltration. Surgical absence of the
gallbladder.  The pancreas, spleen, adrenal glands, kidneys,
abdominal aorta, and retroperitoneal lymph nodes are unremarkable.
The gastric wall is not thickened.  Small bowel are not distended
and there is no bowel wall thickening.  Stool filled colon without
distension or wall thickening.  Small diverticulum in the second
portion of the duodenum.  Prominent visceral adipose tissues.  No
free air or free fluid in the abdomen.

Pelvis:  The uterus is surgically absent.  The no abnormal adnexal
masses.  No free or loculated pelvic fluid collections.  Bladder
wall is not thickened.  No significant pelvic lymphadenopathy.  The
appendix is normal.  No diverticulitis.  The normal alignment of
the lumbar vertebrae with mild degenerative change.
IMPRESSION: Fatty infiltration and cysts in the liver.  No acute process
demonstrated in the abdomen or pelvis.

## 2015-04-15 IMAGING — CT CT PARANASAL SINUSES LIMITED
1 series · 9 of 11 positions shown, 12 images · non-contrast
Comparison: Prior MRI from 06/09/2009.

CLINICAL DATA: Sinusitis/congestion

CT MAXILLOFACIAL WITHOUT CONTRAST
TECHNIQUE: Multidetector CT imaging of the maxillofacial
structures was performed.  Multiplanar CT image reconstructions
were also generated.  A small metallic BB was placed on the right
temple in order to reliably differentiate right from left.

[Series 3: coronal soft · axial · 0.35mm/px · z∈[+20,+100]mm · 9 of 11 slices shown, 12 images]
[im 2/11  brain]
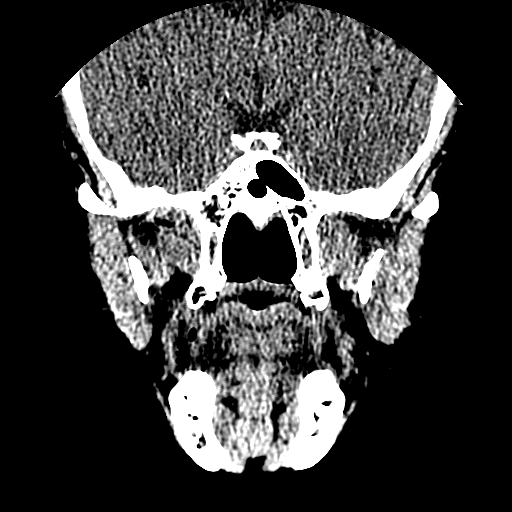
[im 2/11  bone]
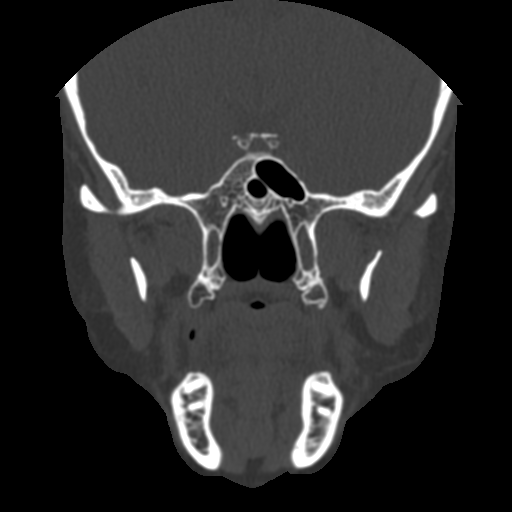
[im 3/11  bone]
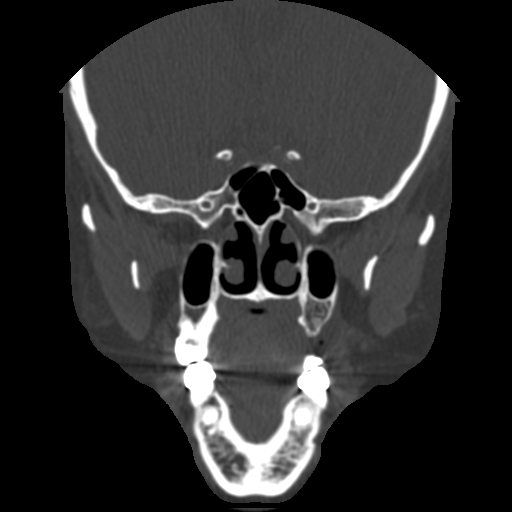
[im 4/11  bone]
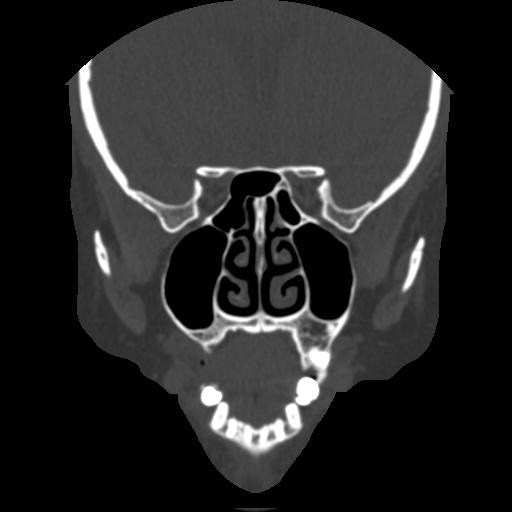
[im 5/11  bone]
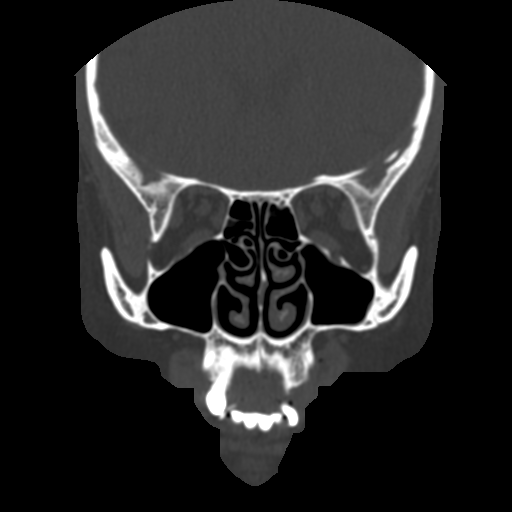
[im 6/11  brain]
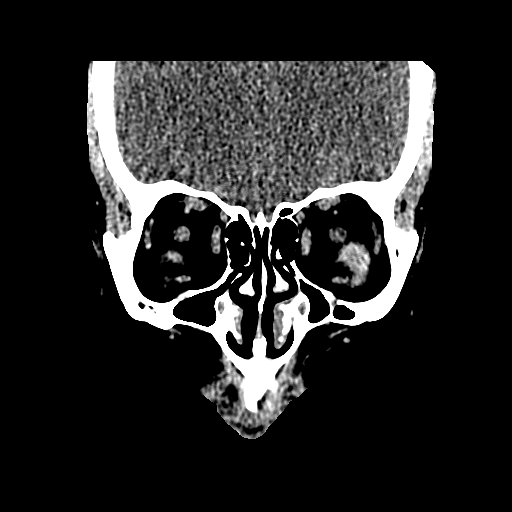
[im 6/11  bone]
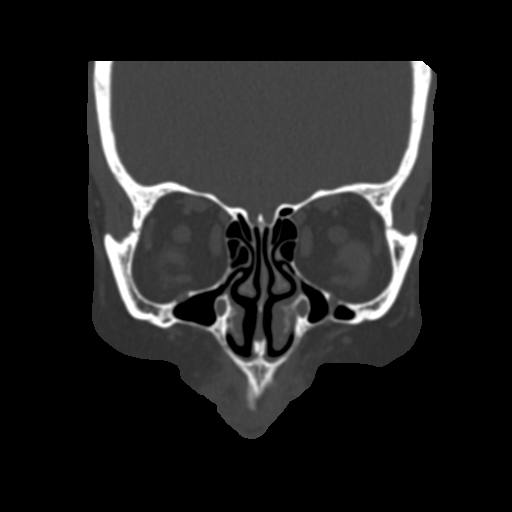
[im 7/11  bone]
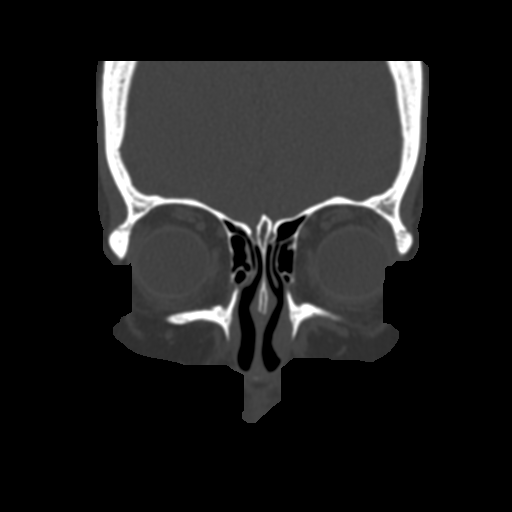
[im 8/11  bone]
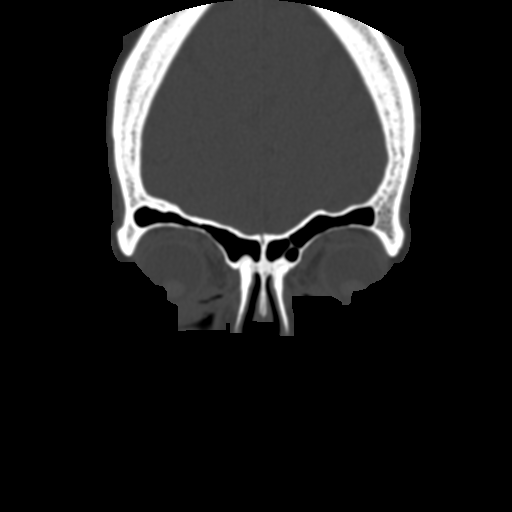
[im 9/11  bone]
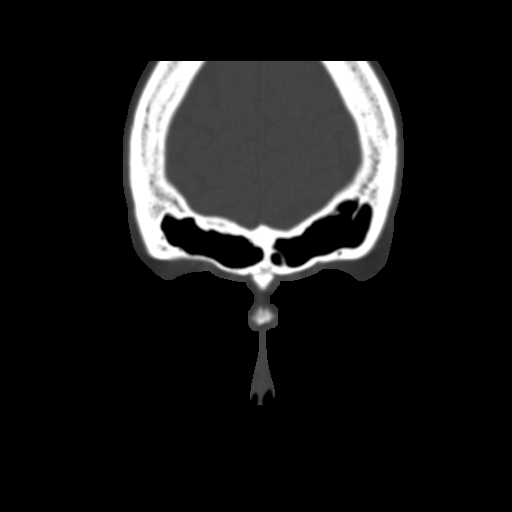
[im 10/11  brain]
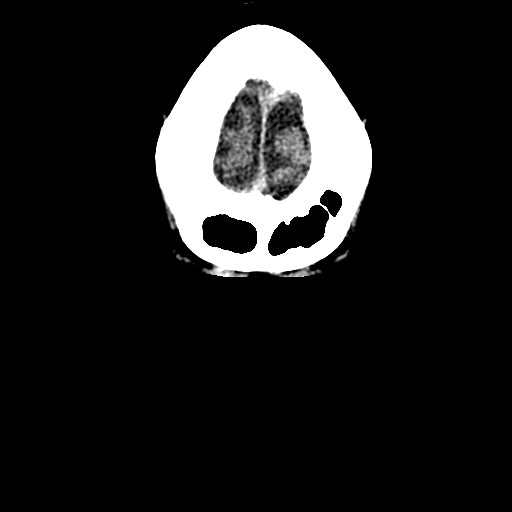
[im 10/11  bone]
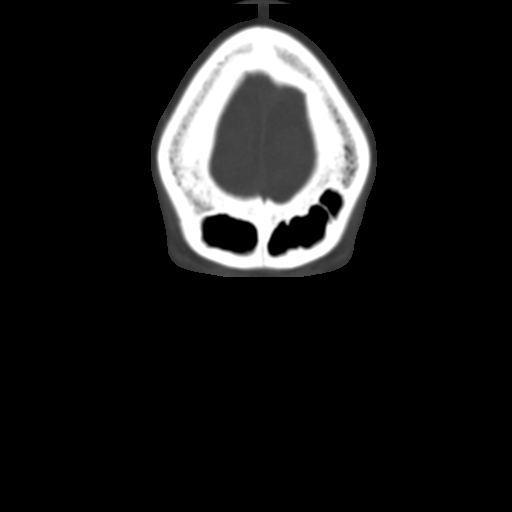

[9 of 11 positions shown; findings below may reference images not displayed]

FINDINGS: The paranasal sinuses including the maxillary sinuses,
sphenoid sinuses, and frontal sinuses are clear.  The ethmoidal air
cells are clear.  The nasal septum is midline.  There is no osseous
dehiscence.  The globes are normal.  Visualized brain is
unremarkable.
IMPRESSION: Clear sinuses.
